# Patient Record
Sex: Male | Born: 1965 | Race: White | Hispanic: No | Marital: Married | Smoking: Never smoker
Health system: Southern US, Community
[De-identification: ages and names within clinical notes are randomized; demographics above are authoritative.]

## PROBLEM LIST (undated history)

## (undated) DIAGNOSIS — C419 Malignant neoplasm of bone and articular cartilage, unspecified: Secondary | ICD-10-CM

## (undated) DIAGNOSIS — I729 Aneurysm of unspecified site: Secondary | ICD-10-CM

## (undated) DIAGNOSIS — K219 Gastro-esophageal reflux disease without esophagitis: Secondary | ICD-10-CM

## (undated) DIAGNOSIS — C61 Malignant neoplasm of prostate: Secondary | ICD-10-CM

## (undated) HISTORY — DX: Gastro-esophageal reflux disease without esophagitis: K21.9

## (undated) HISTORY — PX: APPENDECTOMY: SHX54

---

## 2004-07-03 ENCOUNTER — Ambulatory Visit: Payer: Self-pay | Admitting: Gastroenterology

## 2005-12-22 ENCOUNTER — Emergency Department: Payer: Self-pay | Admitting: Emergency Medicine

## 2008-05-21 ENCOUNTER — Ambulatory Visit: Payer: Self-pay | Admitting: Internal Medicine

## 2008-06-18 DIAGNOSIS — Z86018 Personal history of other benign neoplasm: Secondary | ICD-10-CM

## 2008-06-18 DIAGNOSIS — D239 Other benign neoplasm of skin, unspecified: Secondary | ICD-10-CM

## 2008-06-18 HISTORY — DX: Personal history of other benign neoplasm: Z86.018

## 2008-06-18 HISTORY — DX: Other benign neoplasm of skin, unspecified: D23.9

## 2008-08-12 ENCOUNTER — Ambulatory Visit: Payer: Self-pay | Admitting: Internal Medicine

## 2009-07-17 ENCOUNTER — Ambulatory Visit: Payer: Self-pay | Admitting: Internal Medicine

## 2009-08-26 ENCOUNTER — Encounter: Admission: RE | Admit: 2009-08-26 | Discharge: 2009-08-26 | Payer: Self-pay | Admitting: Specialist

## 2010-05-14 ENCOUNTER — Ambulatory Visit: Payer: Self-pay | Admitting: Internal Medicine

## 2010-10-21 ENCOUNTER — Encounter: Payer: Self-pay | Admitting: Specialist

## 2011-05-17 ENCOUNTER — Ambulatory Visit: Payer: Self-pay

## 2011-09-12 IMAGING — CT CT HEAD WITHOUT CONTRAST
2 series · 15 of 30 positions shown, 19 images · non-contrast
Comparison: none

REASON FOR EXAM: COMMENTS:

[Series 2: without · axial · non-contrast · 0.40mm/px · z∈[-652,-528]mm · 13 of 31 slices shown, 17 images]
[im 3/31  brain]
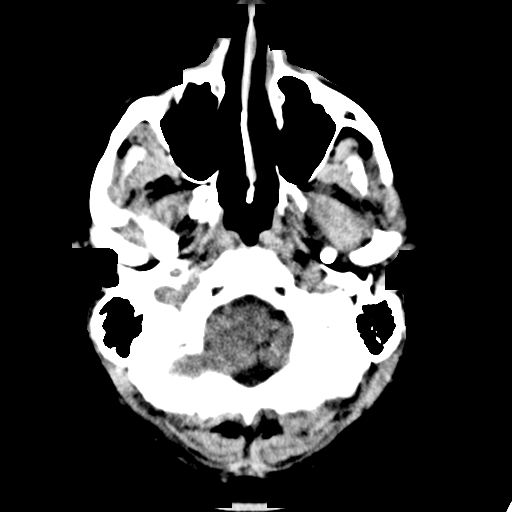
[im 3/31  bone]
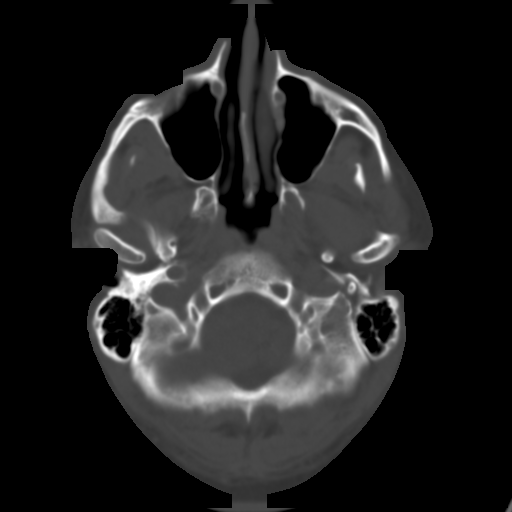
[im 5/31  brain]
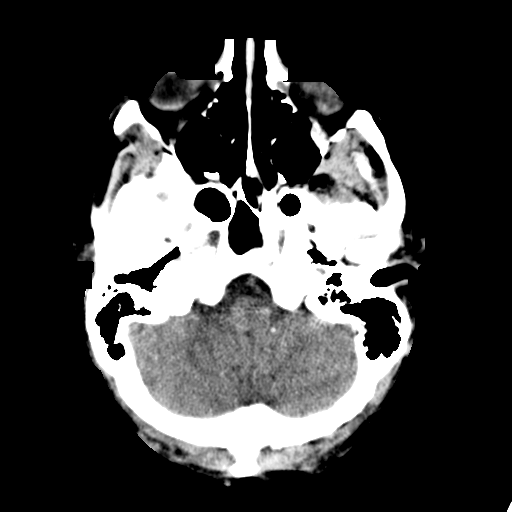
[im 7/31  brain]
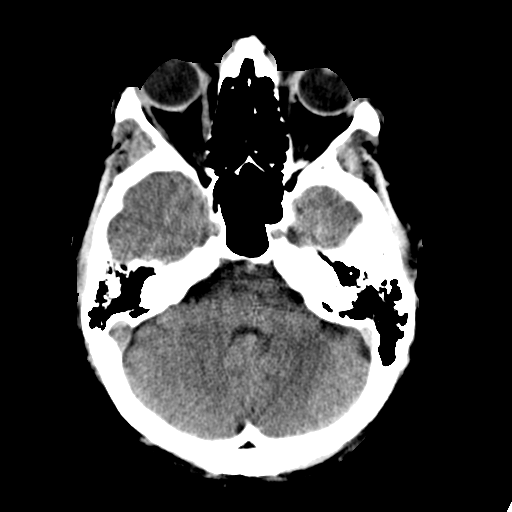
[im 9/31  brain]
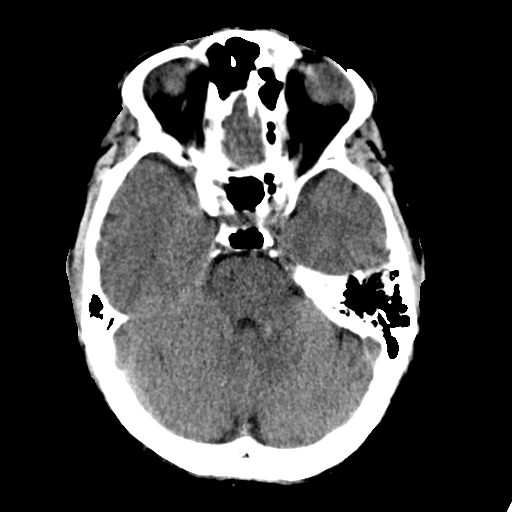
[im 11/31  brain]
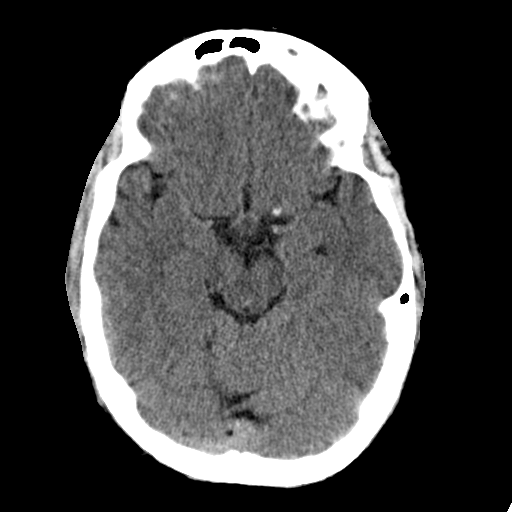
[im 11/31  bone]
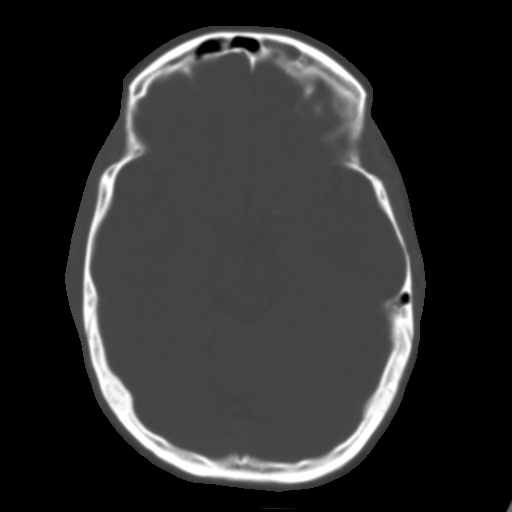
[im 13/31  brain]
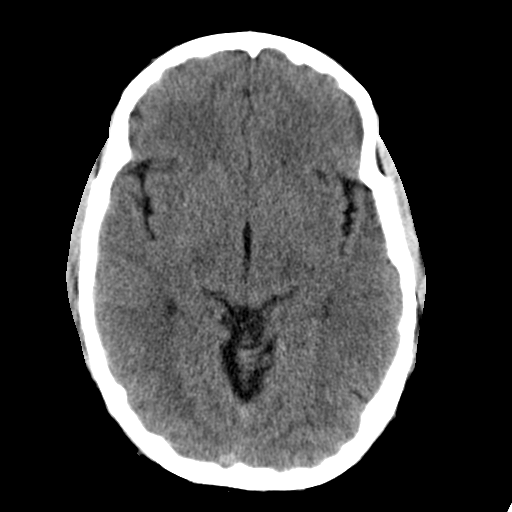
[im 16/31  brain]
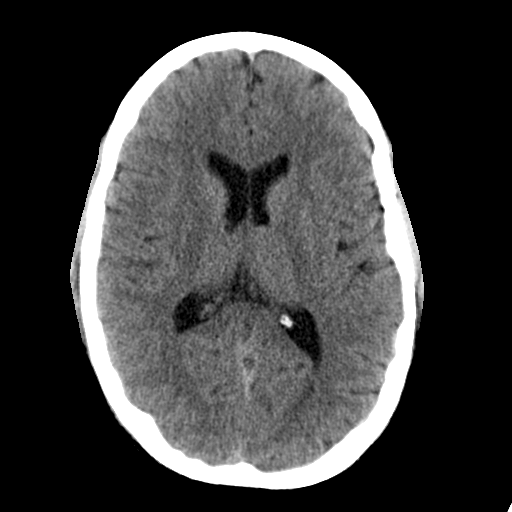
[im 18/31  brain]
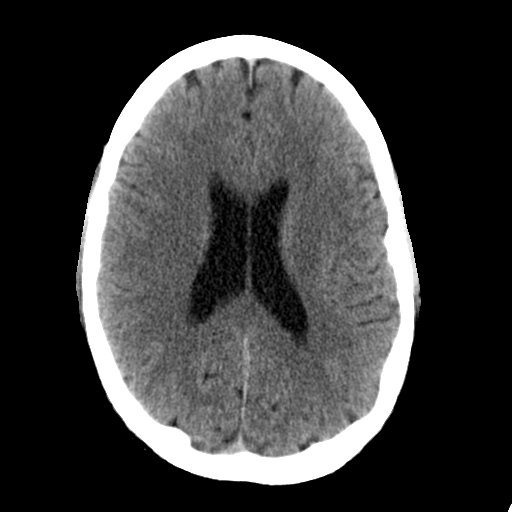
[im 20/31  brain]
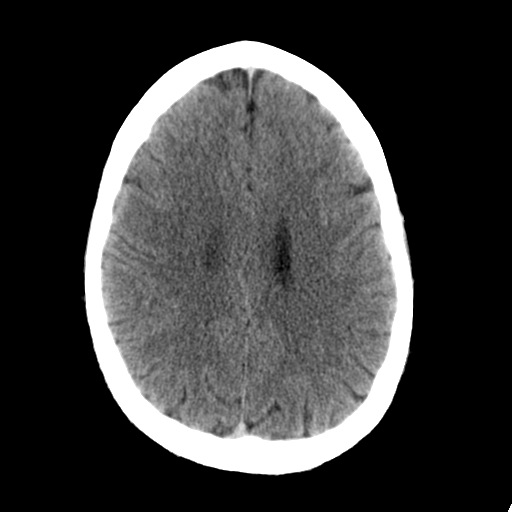
[im 20/31  bone]
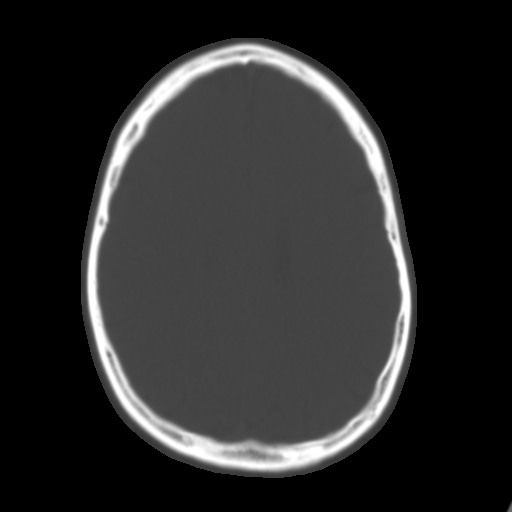
[im 22/31  brain]
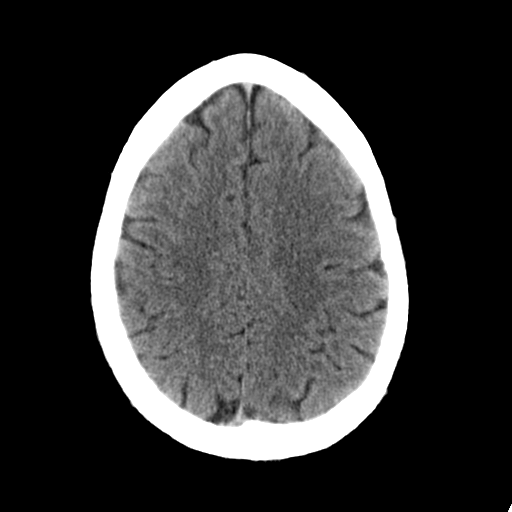
[im 24/31  brain]
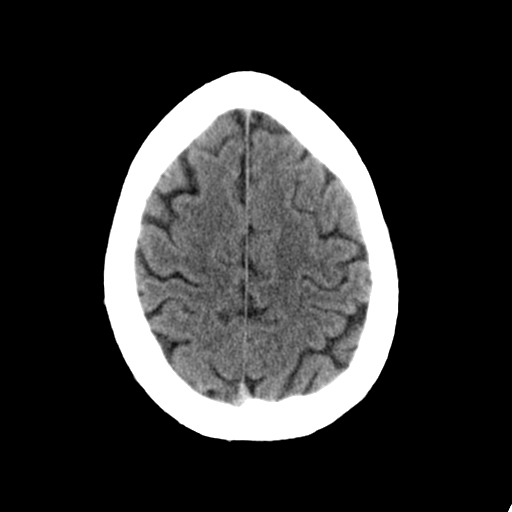
[im 26/31  brain]
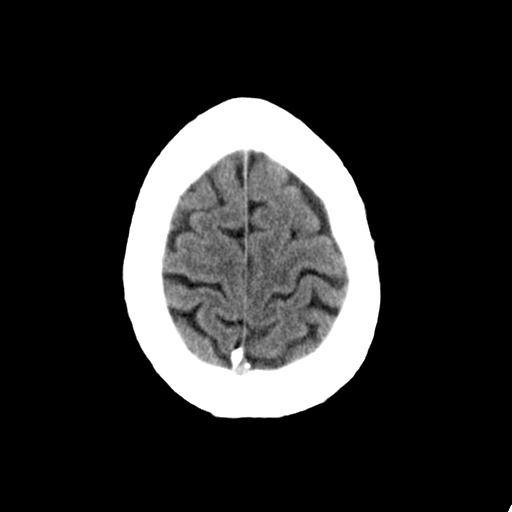
[im 28/31  brain]
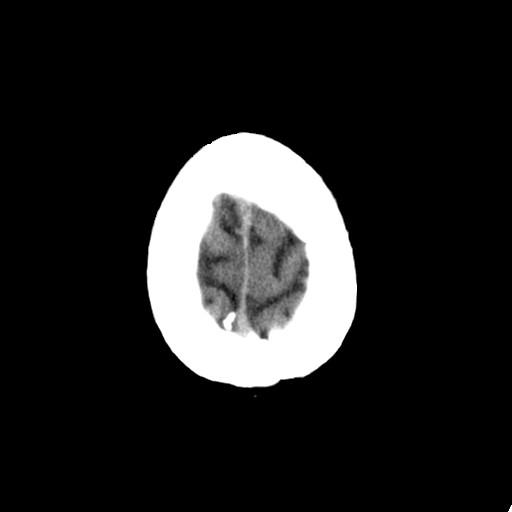
[im 28/31  bone]
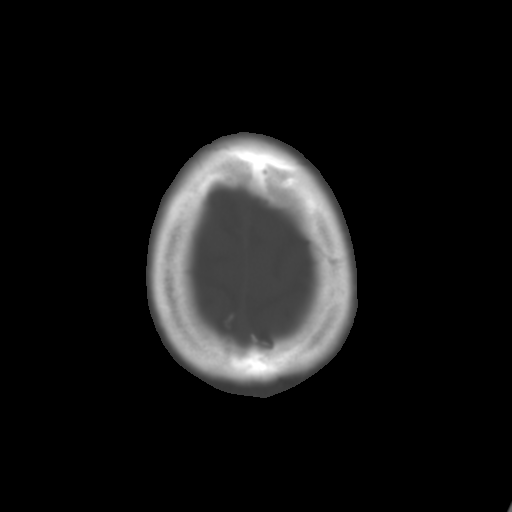

[Series 3: bone · axial · 0.40mm/px · z∈[-652,-632]mm · 2 of 31 slices shown]
[im 3/31  bone]
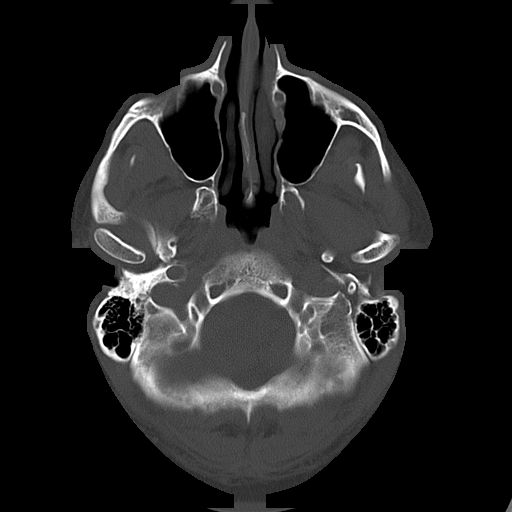
[im 7/31  bone]
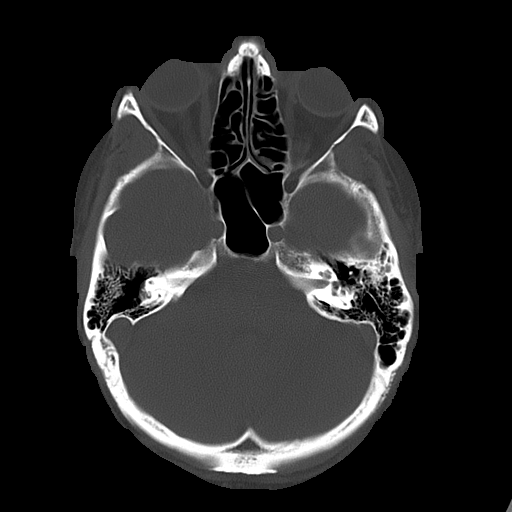

[15 of 30 positions shown; findings below may reference images not displayed]

PROCEDURE:     CT  - CT HEAD WITHOUT CONTRAST  - July 17, 2009  [DATE]

RESULT:      Axial noncontrast CT scanning was performed through the brain
at 5 mm intervals and slice thicknesses. The ventricles are normal in size
and position. There is no intracranial hemorrhage nor intracranial mass
effect. The cerebellum and brainstem are normal in density. There is no
evidence of an evolving ischemic infarction. Within the observed portions of
the maxillary sinuses there large soft tissue density structures compatible
with retention cysts. The remainder of the visualized portions of the
paranasal sinuses are clear. The mastoid air cells are well pneumatized.
There is no evidence of an acute skull fracture.
IMPRESSION: 1. I see no acute abnormality of the brain. Specifically there are no
findings suspicious for an intracranial hemorrhage nor are there findings to
suggest intracranial aneurysms. Further evaluation with MRI and MR
angiography could be performed to evaluate the intracranial vascularity.
2. There are soft tissue density structures within the maxillary sinuses
most compatible with retention cysts.

Addendum: The left frontal sinus contains soft tissue density material which
may reflect acute or chronic inflammatory change. No air-fluid level within
the left frontal sinuses is seen.

## 2012-10-02 ENCOUNTER — Ambulatory Visit: Payer: Self-pay | Admitting: Physician Assistant

## 2012-10-12 ENCOUNTER — Ambulatory Visit: Payer: Self-pay | Admitting: Orthopedic Surgery

## 2012-11-18 ENCOUNTER — Encounter: Payer: Self-pay | Admitting: General Surgery

## 2012-12-19 ENCOUNTER — Encounter: Payer: Self-pay | Admitting: General Surgery

## 2013-09-10 ENCOUNTER — Ambulatory Visit: Payer: Self-pay | Admitting: Physician Assistant

## 2013-11-05 ENCOUNTER — Ambulatory Visit: Payer: Self-pay | Admitting: Orthopedic Surgery

## 2013-11-16 ENCOUNTER — Ambulatory Visit: Payer: Self-pay | Admitting: Orthopedic Surgery

## 2014-02-19 ENCOUNTER — Ambulatory Visit: Payer: Self-pay | Admitting: Physician Assistant

## 2014-07-01 ENCOUNTER — Ambulatory Visit: Payer: Self-pay | Admitting: Gastroenterology

## 2015-10-03 ENCOUNTER — Encounter: Payer: Self-pay | Admitting: Emergency Medicine

## 2015-10-03 ENCOUNTER — Emergency Department
Admission: EM | Admit: 2015-10-03 | Discharge: 2015-10-03 | Disposition: A | Discharge status: Home / Self Care | Payer: 59 | Attending: Emergency Medicine | Admitting: Emergency Medicine

## 2015-10-03 ENCOUNTER — Emergency Department: Payer: 59

## 2015-10-03 DIAGNOSIS — R51 Headache: Secondary | ICD-10-CM | POA: Diagnosis present

## 2015-10-03 DIAGNOSIS — R519 Headache, unspecified: Secondary | ICD-10-CM

## 2015-10-03 HISTORY — DX: Aneurysm of unspecified site: I72.9

## 2015-10-03 LAB — CBC WITH DIFFERENTIAL/PLATELET
BASOS ABS: 0.1 10*3/uL (ref 0–0.1)
BASOS PCT: 1 %
EOS ABS: 0.3 10*3/uL (ref 0–0.7)
EOS PCT: 4 %
HCT: 40.9 % (ref 40.0–52.0)
Hemoglobin: 13.7 g/dL (ref 13.0–18.0)
Lymphocytes Relative: 29 %
Lymphs Abs: 2.5 10*3/uL (ref 1.0–3.6)
MCH: 28.7 pg (ref 26.0–34.0)
MCHC: 33.4 g/dL (ref 32.0–36.0)
MCV: 86 fL (ref 80.0–100.0)
Monocytes Absolute: 0.7 10*3/uL (ref 0.2–1.0)
Monocytes Relative: 9 %
Neutro Abs: 4.9 10*3/uL (ref 1.4–6.5)
Neutrophils Relative %: 57 %
PLATELETS: 242 10*3/uL (ref 150–440)
RBC: 4.76 MIL/uL (ref 4.40–5.90)
RDW: 13.4 % (ref 11.5–14.5)
WBC: 8.5 10*3/uL (ref 3.8–10.6)

## 2015-10-03 LAB — PROTIME-INR
INR: 0.96
PROTHROMBIN TIME: 13 s (ref 11.4–15.0)

## 2015-10-03 LAB — BASIC METABOLIC PANEL
ANION GAP: 5 (ref 5–15)
BUN: 17 mg/dL (ref 6–20)
CALCIUM: 9.2 mg/dL (ref 8.9–10.3)
CO2: 28 mmol/L (ref 22–32)
Chloride: 105 mmol/L (ref 101–111)
Creatinine, Ser: 0.91 mg/dL (ref 0.61–1.24)
GFR calc Af Amer: >60 mL/min (ref >60)
Glucose, Bld: 100 mg/dL — ABNORMAL HIGH (ref 65–99)
POTASSIUM: 4.2 mmol/L (ref 3.5–5.1)
SODIUM: 138 mmol/L (ref 135–145)

## 2015-10-03 LAB — SEDIMENTATION RATE: SED RATE: 11 mm/h (ref 0–20)

## 2015-10-03 LAB — APTT: APTT: 29 s (ref 24–36)

## 2015-10-03 MED ORDER — BUTALBITAL-APAP-CAFFEINE 50-325-40 MG PO TABS: 1.0000 | ORAL_TABLET | Freq: Four times a day (QID) | ORAL | Status: AC | PRN

## 2015-10-03 MED ORDER — SODIUM CHLORIDE 0.9 % IV BOLUS (SEPSIS)
1000.0000 mL | Freq: Once | INTRAVENOUS | Status: AC
  Administered 2015-10-03: 1000 mL via INTRAVENOUS

## 2015-10-03 MED ORDER — PROCHLORPERAZINE EDISYLATE 5 MG/ML IJ SOLN
10.0000 mg | Freq: Once | INTRAMUSCULAR | Status: AC
  Administered 2015-10-03: 10 mg via INTRAVENOUS
  Filled 2015-10-03: qty 2

## 2015-10-03 MED ORDER — DIPHENHYDRAMINE HCL 50 MG/ML IJ SOLN
50.0000 mg | Freq: Once | INTRAMUSCULAR | Status: AC
  Administered 2015-10-03: 50 mg via INTRAVENOUS

## 2015-10-03 MED ORDER — LIDOCAINE HCL (PF) 1 % IJ SOLN
INTRAMUSCULAR | Status: AC
  Administered 2015-10-03: 5 mL via INTRADERMAL
  Filled 2015-10-03: qty 5

## 2015-10-03 MED ORDER — LIDOCAINE HCL (PF) 1 % IJ SOLN
5.0000 mL | Freq: Once | INTRAMUSCULAR | Status: AC
  Administered 2015-10-03: 5 mL via INTRADERMAL

## 2015-10-03 MED ORDER — DIPHENHYDRAMINE HCL 50 MG/ML IJ SOLN
INTRAMUSCULAR | Status: AC
  Administered 2015-10-03: 50 mg via INTRAVENOUS
  Filled 2015-10-03: qty 1

## 2015-10-03 NOTE — ED Notes (Signed)
Dr Clearnce Hasten unable to obtain CSF, pt opted to not continue for LP procedure.

## 2015-10-03 NOTE — ED Provider Notes (Signed)
[facility] Emergency Department Provider Note  ____________________________________________  Time seen: Approximately [id number] PM  I have reviewed the triage vital signs and the nursing notes.   HISTORY  Chief Complaint Headache    HPI Ralph Villegas is a 50 y.o. male with a history of a cerebral aneurysm who is presenting today with a right-sided headache over the past 24 hours. He said that last night he had a sudden onset "20 out of 10" headache to the right side of his head which felt like a stabbing pain is of an arrow was going into his head. He said that the light as well as noises worsen his headache. He has no vision change. Denies any nausea or vomiting. Michela Pitcher had been diagnosed with a cerebral aneurysm many years ago and "had some shots." Michela Pitcher his sister also died from a cerebral aneurysm at 52 years old. Denies any neck stiffness. Denies any numbness or weakness.Patient says also has a history of cluster headaches but has not had them in many years. Does not remember if that felt exactly like this.   Past Medical History  Diagnosis Date  . Aneurysm ([place])     There are no active problems to display for this patient.   History reviewed. No pertinent past surgical history.  No current outpatient prescriptions on file.  Allergies Review of patient's allergies indicates no known allergies.  No family history on file.  Social History Social History  Substance Use Topics  . Smoking status: Never Smoker   . Smokeless tobacco: None  . Alcohol Use: Yes    Review of Systems Constitutional: No fever/chills Eyes: No visual changes. ENT: No sore throat. Cardiovascular: Denies chest pain. Respiratory: Denies shortness of breath. Gastrointestinal: No abdominal pain.  No nausea, no vomiting.  No diarrhea.  No constipation. Genitourinary: Negative for dysuria. Musculoskeletal: Negative for back pain. Skin: Negative for rash. Neurological: Negative  for focal weakness or numbness.  10-point ROS otherwise negative.  ____________________________________________   PHYSICAL EXAM:  VITAL SIGNS: ED Triage Vitals  Enc Vitals Group     BP 10/03/15 1736 112/72 mmHg     Pulse Rate 10/03/15 1736 77     Resp 10/03/15 1736 18     Temp 10/03/15 1736 98.2 F (36.8 C)     Temp Source 10/03/15 1736 Oral     SpO2 10/03/15 1736 98 %     Weight 10/03/15 1736 233 lb (105.688 kg)     Height 10/03/15 1736 5\' 10"  (1.778 m)     Head Cir --      Peak Flow --      Pain Score 10/03/15 1752 3     Pain Loc --      Pain Edu? --      Excl. in [place]? --     Constitutional: Alert and oriented. Well appearing and in no acute distress. Eyes: Conjunctivae are normal. PERRL. EOMI. Head: Atraumatic. No tenderness to the right temple or nodularity along the temporal arteries. Nose: No congestion/rhinnorhea. Mouth/Throat: Mucous membranes are moist.  Oropharynx non-erythematous. Neck: No stridor.  No meningismus. Cardiovascular: Normal rate, regular rhythm. Grossly normal heart sounds.  Good peripheral circulation. Respiratory: Normal respiratory effort.  No retractions. Lungs CTAB. Gastrointestinal: Soft and nontender. No distention. No abdominal bruits. No CVA tenderness. Musculoskeletal: No lower extremity tenderness nor edema.  No joint effusions. Neurologic:  Normal speech and language. No gross focal neurologic deficits are appreciated. No gait instability. Skin:  Skin is warm, dry  and intact. No rash noted. Psychiatric: Mood and affect are normal. Speech and behavior are normal.  ____________________________________________   LABS (all labs ordered are listed, but only abnormal results are displayed)  Labs Reviewed  BASIC METABOLIC PANEL - Abnormal; Notable for the following:    Glucose, Bld 100 (*)    All other components within normal limits  CBC WITH DIFFERENTIAL/PLATELET  PROTIME-INR  APTT  SEDIMENTATION RATE  CSF CELL COUNT WITH  DIFFERENTIAL  CSF CELL COUNT WITH DIFFERENTIAL   ____________________________________________  EKG   ____________________________________________  RADIOLOGY  Normal CT of the brain. ____________________________________________   PROCEDURES  LUMBAR PUNCTURE  Date/Time: 10/03/2015 at 1010pm Performed by: Doran Stabler  Consent: Verbal consent obtained. Written consent obtained. Risks and benefits: risks, benefits and alternatives were discussed Consent given by: Patient Patient understanding: patient states understanding of the procedure being performed  Patient consent: the patient's understanding of the procedure matches consent given  Procedure consent: procedure consent matches procedure scheduled  Relevant documents: relevant documents present and verified  Test results: test results available and properly labeled Site marked: the operative site was marked Imaging studies: imaging studies available  Required items: required blood products, implants, devices, and special equipment available  Patient identity confirmed: verbally with patient and arm band  Time out: Immediately prior to procedure a "time out" was called to verify the correct patient, procedure, equipment, support staff and site/side marked as required.  Indications: Sudden onset headache with history of aneurysm  Anesthesia: local infiltration Local anesthetic: lidocaine 1% without epinephrine Anesthetic total: 7 ml Patient sedated: No  Analgesia: Local  Preparation: Patient was prepped and draped in the usual sterile fashion. Lumbar space: L3-L4 interspace Patient's position: left lateral decubitus Needle gauge: 22 Needle length: 3.5 in Number of attempts: 1  Several attempts made with only bone contacted. Position 3 times with still bone just contacted. Attempted to reposition patient with head further towards knees and needs to chest. Patient asked to stop procedure secondary to multiple  attempts without success.  Post-procedure: site cleaned and adhesive bandage applied Patient tolerance: Patient tolerated the procedure well with no immediate complications  ____________________________________________   INITIAL IMPRESSION / ASSESSMENT AND PLAN / ED COURSE  Pertinent labs & imaging results that were available during my care of the patient were reviewed by me and considered in my medical decision making (see chart for details).  ----------------------------------------- 10:49 PM on 10/03/2015 -----------------------------------------  Patient completely headache free at this time. Although the patient is a high risk for aneurysmal bleed it is possible that this could be cluster headache which the patient also has a history of. He understands that not pursuing further workup at this time could result in severe) disability and possibly death. He is acutely aware of this because it persisted that died at 50 years old. However, he is refusing further attempts for LP. He understands the risks. Will be discharged home. Patient understands that he will need to follow-up with his headache specialist in [facility] which he is seen in the past. Understands return to the emergency department immediately for any worsening or concerning symptoms.   ____________________________________________   FINAL CLINICAL IMPRESSION(S) / ED DIAGNOSES  Headache. Resolved.    Orbie Pyo, MD 10/03/15 [phone]

## 2015-10-03 NOTE — Discharge Instructions (Signed)

## 2015-10-03 NOTE — ED Notes (Signed)
Pt presents with headache that started last night. Pt states out of the blue felt like had been hit in the head with a bow and arrow. Pt c/o right side headache.  Pt with family  Hx of aneurysms and sister died with same. Pt dx with small stable aneurysm 10-12 yrs ago. Pt denies any vision changes.

## 2015-10-03 NOTE — ED Notes (Signed)
Pt called on call bell, pt c/o agitation and anxiety, informed Dr Clearnce Hasten, new orders carried out.

## 2015-10-15 ENCOUNTER — Other Ambulatory Visit: Payer: Self-pay | Admitting: Nurse Practitioner

## 2015-10-15 DIAGNOSIS — R519 Headache, unspecified: Secondary | ICD-10-CM

## 2015-10-15 DIAGNOSIS — R51 Headache: Principal | ICD-10-CM

## 2015-10-29 ENCOUNTER — Other Ambulatory Visit: Payer: Self-pay | Admitting: Nurse Practitioner

## 2015-10-29 ENCOUNTER — Ambulatory Visit: Payer: 59

## 2015-10-29 ENCOUNTER — Ambulatory Visit
Admission: RE | Admit: 2015-10-29 | Discharge: 2015-10-29 | Disposition: A | Discharge status: Home / Self Care | Payer: 59 | Source: Ambulatory Visit | Attending: Nurse Practitioner | Admitting: Nurse Practitioner

## 2015-10-29 DIAGNOSIS — R51 Headache: Secondary | ICD-10-CM | POA: Diagnosis present

## 2015-10-29 DIAGNOSIS — I671 Cerebral aneurysm, nonruptured: Secondary | ICD-10-CM | POA: Insufficient documentation

## 2015-10-29 DIAGNOSIS — R519 Headache, unspecified: Secondary | ICD-10-CM

## 2016-11-05 DIAGNOSIS — R42 Dizziness and giddiness: Secondary | ICD-10-CM | POA: Diagnosis not present

## 2016-11-05 DIAGNOSIS — R0600 Dyspnea, unspecified: Secondary | ICD-10-CM | POA: Diagnosis not present

## 2016-11-05 DIAGNOSIS — I671 Cerebral aneurysm, nonruptured: Secondary | ICD-10-CM | POA: Diagnosis not present

## 2016-11-09 ENCOUNTER — Other Ambulatory Visit: Payer: Self-pay | Admitting: Internal Medicine

## 2016-11-09 DIAGNOSIS — I671 Cerebral aneurysm, nonruptured: Secondary | ICD-10-CM

## 2016-11-10 DIAGNOSIS — I6523 Occlusion and stenosis of bilateral carotid arteries: Secondary | ICD-10-CM | POA: Diagnosis not present

## 2016-11-17 DIAGNOSIS — R0600 Dyspnea, unspecified: Secondary | ICD-10-CM | POA: Diagnosis not present

## 2016-11-19 ENCOUNTER — Ambulatory Visit
Admission: RE | Admit: 2016-11-19 | Discharge: 2016-11-19 | Disposition: A | Discharge status: Home / Self Care | Payer: 59 | Source: Ambulatory Visit | Attending: Internal Medicine | Admitting: Internal Medicine

## 2016-11-19 DIAGNOSIS — I671 Cerebral aneurysm, nonruptured: Secondary | ICD-10-CM | POA: Insufficient documentation

## 2016-12-14 DIAGNOSIS — Z125 Encounter for screening for malignant neoplasm of prostate: Secondary | ICD-10-CM | POA: Diagnosis not present

## 2016-12-14 DIAGNOSIS — Z131 Encounter for screening for diabetes mellitus: Secondary | ICD-10-CM | POA: Diagnosis not present

## 2016-12-14 DIAGNOSIS — E781 Pure hyperglyceridemia: Secondary | ICD-10-CM | POA: Diagnosis not present

## 2016-12-14 DIAGNOSIS — Z0001 Encounter for general adult medical examination with abnormal findings: Secondary | ICD-10-CM | POA: Diagnosis not present

## 2016-12-16 DIAGNOSIS — M545 Low back pain: Secondary | ICD-10-CM | POA: Diagnosis not present

## 2016-12-16 DIAGNOSIS — R0600 Dyspnea, unspecified: Secondary | ICD-10-CM | POA: Diagnosis not present

## 2016-12-16 DIAGNOSIS — I6523 Occlusion and stenosis of bilateral carotid arteries: Secondary | ICD-10-CM | POA: Diagnosis not present

## 2016-12-20 ENCOUNTER — Other Ambulatory Visit: Payer: Self-pay

## 2017-01-12 ENCOUNTER — Encounter: Payer: Self-pay | Admitting: Urology

## 2017-01-12 ENCOUNTER — Ambulatory Visit (INDEPENDENT_AMBULATORY_CARE_PROVIDER_SITE_OTHER): Payer: 59 | Admitting: Urology

## 2017-01-12 DIAGNOSIS — N410 Acute prostatitis: Secondary | ICD-10-CM | POA: Diagnosis not present

## 2017-01-12 DIAGNOSIS — R972 Elevated prostate specific antigen [PSA]: Secondary | ICD-10-CM | POA: Diagnosis not present

## 2017-01-12 MED ORDER — TAMSULOSIN HCL 0.4 MG PO CAPS: 0.4000 mg | ORAL_CAPSULE | Freq: Every day | ORAL | 0 refills | Status: DC

## 2017-01-12 MED ORDER — SULFAMETHOXAZOLE-TRIMETHOPRIM 800-160 MG PO TABS: 1.0000 | ORAL_TABLET | Freq: Two times a day (BID) | ORAL | 0 refills | Status: DC

## 2017-01-12 MED ORDER — SULFAMETHOXAZOLE-TRIMETHOPRIM 800-160 MG PO TABS: 1.0000 | ORAL_TABLET | Freq: Two times a day (BID) | ORAL | 0 refills | Status: AC

## 2017-01-12 MED ORDER — SULFAMETHOXAZOLE-TRIMETHOPRIM 800-160 MG PO TABS: 1.0000 | ORAL_TABLET | Freq: Once | ORAL | 0 refills | Status: AC

## 2017-01-12 MED ORDER — MELOXICAM 15 MG PO TABS: 15.0000 mg | ORAL_TABLET | Freq: Every day | ORAL | 0 refills | Status: DC

## 2017-01-12 NOTE — Progress Notes (Signed)
01/12/2017 11:33 AM   Ralph Villegas 1965-09-22 [id number]  Referring provider: Lavera Guise, MD [address] [place], [place] [address]  Chief Complaint  Patient presents with  . Elevated PSA    New patient     HPI: 51 year old otherwise healthy male presents to the urology clinic for further management and evaluation of an elevated PSA.  The patient was noted to have an elevated PSA as part of a routine physical exam. The patient states that he has had maybe some slight discomfort and more frequent urination recently, however he is not overly symptomatic. He also describes a weak stream.he denies any dysuria or gross hematuria.  The patient has been seen by urology in 2014 and diagnosed with prostatitis Since that time, he has not had any urinary tract infections or new problems. He denies a history of nephrolithiasis.     PSA history: 11/2011:  2.5 12/20/16: 12.8  PMH: Past Medical History:  Diagnosis Date  . Acid reflux   . Aneurysm [facility])     Surgical History: Past Surgical History:  Procedure Laterality Date  . APPENDECTOMY      Home Medications:  Allergies as of 01/12/2017      Reactions   Acyclovir And Related       Medication List    as of 01/12/2017 11:33 AM   You have not been prescribed any medications.     Allergies:  Allergies  Allergen Reactions  . Acyclovir And Related     Family History: Family History  Problem Relation Age of Onset  . Prostate cancer Maternal Uncle     Social History:  reports that he has never smoked. He has never used smokeless tobacco. He reports that he drinks alcohol. His drug history is not on file.  ROS: UROLOGY Frequent Urination?: Yes Hard to postpone urination?: No Burning/pain with urination?: No Get up at night to urinate?: No Leakage of urine?: No Urine stream starts and stops?: No Trouble starting stream?: No Do you have to strain to urinate?: No Blood in urine?: No Urinary tract infection?:  No Sexually transmitted disease?: No Injury to kidneys or bladder?: No Painful intercourse?: No Weak stream?: Yes Erection problems?: No Penile pain?: No  Gastrointestinal Nausea?: No Vomiting?: No Indigestion/heartburn?: No Diarrhea?: No Constipation?: No  Constitutional Fever: No Night sweats?: No Weight loss?: No Fatigue?: No  Skin Skin rash/lesions?: No Itching?: No  Eyes Blurred vision?: No Double vision?: No  Ears/Nose/Throat Sore throat?: No Sinus problems?: No  Hematologic/Lymphatic Swollen glands?: No Easy bruising?: No  Cardiovascular Leg swelling?: No Chest pain?: No  Respiratory Cough?: No Shortness of breath?: No  Endocrine Excessive thirst?: No  Musculoskeletal Back pain?: Yes Joint pain?: No  Neurological Headaches?: Yes Dizziness?: No  Psychologic Depression?: No Anxiety?: No  Physical Exam: There were no vitals taken for this visit.  Constitutional:  Alert and oriented, No acute distress. HEENT: [place] AT, moist mucus membranes.  Trachea midline, no masses. Cardiovascular: No clubbing, cyanosis, or edema. Respiratory: Normal respiratory effort, no increased work of breathing. GI: Abdomen is soft, nontender, nondistended, no abdominal masses GU: No CVA tenderness. DRE: Prostate is indurated and severely tender diffusely. There are no discrete nodules Skin: No rashes, bruises or suspicious lesions. Lymph: No cervical or inguinal adenopathy. Neurologic: Grossly intact, no focal deficits, moving all 4 extremities. Psychiatric: Normal mood and affect.  Laboratory Data: Lab Results  Component Value Date   WBC 8.5 10/03/2015   HGB 13.7 10/03/2015   HCT  40.9 10/03/2015   MCV 86.0 10/03/2015   PLT 242 10/03/2015    Lab Results  Component Value Date   CREATININE 0.91 10/03/2015    No results found for: PSA  No results found for: TESTOSTERONE  No results found for: HGBA1C  Urinalysis No results found for: COLORURINE,  APPEARANCEUR, LABSPEC, PHURINE, GLUCOSEU, HGBUR, BILIRUBINUR, KETONESUR, PROTEINUR, UROBILINOGEN, NITRITE, LEUKOCYTESUR  Pertinent Imaging:   Assessment & Plan:  The patient presenten elevated PSA, I suspect what he actually has acute prostatitis.This is based on his voiding symptoms and tenderness to palpation on his DRE. We are treating him as such.  I have prescribed one month Bactrim double strength twice a day, tamsulosin 0.4 milligrams daily, and Mobitz 15 mg daily. We will plan to follow up with the patient 6 weeks with a PSA prior.  1. Elevated PSA    Return in about 6 weeks (around 02/23/2017) for w/ PSA prior.  Ardis Hughs, [facility] [address] [place], [place] [address] [phone]

## 2017-01-12 NOTE — Addendum Note (Signed)
Addended by: Wilson Singer on: 01/12/2017 01:51 PM   Modules accepted: Orders

## 2017-01-12 NOTE — Addendum Note (Signed)
Addended by: Wilson Singer on: 01/12/2017 04:58 PM   Modules accepted: Orders

## 2017-02-17 ENCOUNTER — Other Ambulatory Visit: Payer: Self-pay

## 2017-02-17 DIAGNOSIS — R972 Elevated prostate specific antigen [PSA]: Secondary | ICD-10-CM

## 2017-02-21 ENCOUNTER — Other Ambulatory Visit: Payer: 59

## 2017-02-21 DIAGNOSIS — R972 Elevated prostate specific antigen [PSA]: Secondary | ICD-10-CM

## 2017-02-22 LAB — PSA: Prostate Specific Ag, Serum: 14.8 ng/mL — ABNORMAL HIGH (ref 0.0–4.0)

## 2017-02-23 ENCOUNTER — Encounter: Payer: Self-pay | Admitting: Urology

## 2017-02-23 ENCOUNTER — Ambulatory Visit (INDEPENDENT_AMBULATORY_CARE_PROVIDER_SITE_OTHER): Payer: 59 | Admitting: Urology

## 2017-02-23 VITALS — BP 93/58 | HR 70 | Ht 70.0 in | Wt 229.7 lb

## 2017-02-23 DIAGNOSIS — N410 Acute prostatitis: Secondary | ICD-10-CM | POA: Diagnosis not present

## 2017-02-23 DIAGNOSIS — R972 Elevated prostate specific antigen [PSA]: Secondary | ICD-10-CM

## 2017-02-23 LAB — URINALYSIS, COMPLETE
Bilirubin, UA: NEGATIVE
GLUCOSE, UA: NEGATIVE
Ketones, UA: NEGATIVE
LEUKOCYTES UA: NEGATIVE
Nitrite, UA: NEGATIVE
PH UA: 7 (ref 5.0–7.5)
PROTEIN UA: NEGATIVE
RBC UA: NEGATIVE
SPEC GRAV UA: 1.015 (ref 1.005–1.030)
Urobilinogen, Ur: 0.2 mg/dL (ref 0.2–1.0)

## 2017-02-23 NOTE — Progress Notes (Signed)
02/23/2017 10:14 AM   Ralph Villegas 12-23-1965  It is oozing a biopsy and biopsy instructions and down his urine was clear [id number]  Referring provider: Lavera Guise, MD [address] [place], [place] [address]  Chief Complaint  Patient presents with  . Elevated PSA    HPI:  51 year old otherwise healthy male presents to the urology clinic for further management and evaluation of an elevated PSA.  The patient was noted to have an elevated PSA as part of a routine physical exam. The patient states that he has had maybe some slight discomfort and more frequent urination recently, however he is not overly symptomatic. He also describes a weak stream.he denies any dysuria or gross hematuria.  No FH PCa.   PSA history: 11/2011:  2.5 12/20/16: 12.8  He was seen 01/12/2017 when on DRE the prostate was noted to be indurated and severely tender. I do not see a urinalysis. It was hypothesized he had prostatitis and prescribed Bactrim. His PSA rose to 14.8.  UA clear.   PMH: Past Medical History:  Diagnosis Date  . Acid reflux   . Aneurysm [facility])     Surgical History: Past Surgical History:  Procedure Laterality Date  . APPENDECTOMY      Home Medications:  Allergies as of 02/23/2017      Reactions   Acyclovir And Related       Medication List       Accurate as of 02/23/17 10:14 AM. Always use your most recent med list.          meloxicam 15 MG tablet Commonly known as:  MOBIC Take 1 tablet (15 mg total) by mouth daily.   tamsulosin 0.4 MG Caps capsule Commonly known as:  FLOMAX Take 1 capsule (0.4 mg total) by mouth daily.       Allergies:  Allergies  Allergen Reactions  . Acyclovir And Related     Family History: Family History  Problem Relation Age of Onset  . Prostate cancer Maternal Uncle     Social History:  reports that he has never smoked. He has never used smokeless tobacco. He reports that he drinks alcohol. His drug history is not on  file.  ROS: UROLOGY Frequent Urination?: No Hard to postpone urination?: No Burning/pain with urination?: No Get up at night to urinate?: No Leakage of urine?: No Urine stream starts and stops?: No Trouble starting stream?: No Do you have to strain to urinate?: No Blood in urine?: No Urinary tract infection?: No Sexually transmitted disease?: No Injury to kidneys or bladder?: No Painful intercourse?: No Weak stream?: Yes Erection problems?: No Penile pain?: No  Gastrointestinal Nausea?: No Vomiting?: No Indigestion/heartburn?: No Diarrhea?: No Constipation?: No  Constitutional Fever: No Night sweats?: No Weight loss?: No Fatigue?: No  Skin Skin rash/lesions?: No Itching?: No  Eyes Blurred vision?: No Double vision?: No  Ears/Nose/Throat Sore throat?: No Sinus problems?: No  Hematologic/Lymphatic Swollen glands?: No Easy bruising?: No  Cardiovascular Leg swelling?: No Chest pain?: No  Respiratory Cough?: No Shortness of breath?: No  Endocrine Excessive thirst?: No  Musculoskeletal Back pain?: No Joint pain?: No  Neurological Headaches?: No Dizziness?: No  Psychologic Depression?: No Anxiety?: No  Physical Exam: BP (!) 93/58 (BP Location: Left Arm, Patient Position: Sitting, Cuff Size: Normal)   Pulse 70   Ht 5\' 10"  (1.778 m)   Wt 104.2 kg (229 lb 11.2 oz)   BMI 32.96 kg/m   Constitutional:  Alert and oriented, No acute  distress. HEENT: [place] AT, moist mucus membranes.  Trachea midline, no masses. Cardiovascular: No clubbing, cyanosis, or edema. Respiratory: Normal respiratory effort, no increased work of breathing. GI: Abdomen is soft, nontender, nondistended, no abdominal masses Skin: No rashes, bruises or suspicious lesions. Lymph: No cervical or inguinal adenopathy. Neurologic: Grossly intact, no focal deficits, moving all 4 extremities. Psychiatric: Normal mood and affect.  Laboratory Data: Lab Results  Component Value Date     WBC 8.5 10/03/2015   HGB 13.7 10/03/2015   HCT 40.9 10/03/2015   MCV 86.0 10/03/2015   PLT 242 10/03/2015    Lab Results  Component Value Date   CREATININE 0.91 10/03/2015    No results found for: PSA  No results found for: TESTOSTERONE  No results found for: HGBA1C  Urinalysis No results found for: COLORURINE, APPEARANCEUR, LABSPEC, [place], GLUCOSEU, HGBUR, BILIRUBINUR, KETONESUR, PROTEINUR, UROBILINOGEN, NITRITE, LEUKOCYTESUR   Assessment & Plan:    PSA elevation - I had a long discussion with the patient on the nature of elevated PSA - benign vs malignant causes. We discussed the nature risks and benefits of continued surveillance, other lab tests, imaging as well as prostate biopsy. We discussed the management of prostate cancer might include active surveillance or treatment depending on biopsy findings. All questions answered. He elects to proceed with prostate biopsy.   There are no diagnoses linked to this encounter.  No Follow-up on file.  Festus Aloe, [facility] [address] [place], [place] [address] [phone]

## 2017-03-01 ENCOUNTER — Encounter: Payer: Self-pay | Admitting: Urology

## 2017-03-01 ENCOUNTER — Ambulatory Visit (INDEPENDENT_AMBULATORY_CARE_PROVIDER_SITE_OTHER): Payer: 59 | Admitting: Urology

## 2017-03-01 ENCOUNTER — Other Ambulatory Visit: Payer: Self-pay | Admitting: Urology

## 2017-03-01 VITALS — BP 125/74 | HR 77 | Ht 70.0 in | Wt 231.5 lb

## 2017-03-01 DIAGNOSIS — R972 Elevated prostate specific antigen [PSA]: Secondary | ICD-10-CM

## 2017-03-01 DIAGNOSIS — N4232 Atypical small acinar proliferation of prostate: Secondary | ICD-10-CM | POA: Diagnosis not present

## 2017-03-01 DIAGNOSIS — C61 Malignant neoplasm of prostate: Secondary | ICD-10-CM | POA: Diagnosis not present

## 2017-03-01 MED ORDER — LEVOFLOXACIN 500 MG PO TABS
500.0000 mg | ORAL_TABLET | Freq: Once | ORAL | Status: AC
  Administered 2017-03-01: 500 mg via ORAL

## 2017-03-01 MED ORDER — GENTAMICIN SULFATE 40 MG/ML IJ SOLN
80.0000 mg | Freq: Once | INTRAMUSCULAR | Status: AC
  Administered 2017-03-01: 80 mg via INTRAMUSCULAR

## 2017-03-01 NOTE — Progress Notes (Signed)
HPI: F/u PSA elevation for biopsy. The patient was noted to have an elevated PSA as part of a routine physical exam associated with some slight discomfort and more frequent urination. Treated with abx. PSA rose. UA clear. He also described a weak stream. He denies any dysuria or gross hematuria.  No FH PCa.   PSA history: 11/2011: 2.5 12/20/16: 12.8 01/12/2017 DRE - prostate was noted to be indurated and severely tender. Tried Bactrim.  02/21/2017 PSA 14.8 - UA clear   Prostate Biopsy Procedure   Informed consent was obtained after discussing risks/benefits of the procedure.  A time out was performed to ensure correct patient identity.  Pre-Procedure: - Last PSA Level: No results found for: PSA - Gentamicin given prophylactically - Levaquin 500 mg administered PO -DRE: 30 g prostate, benign - no hard area or nodule  -Transrectal Ultrasound performed revealing a 42.55 gm prostate (maybe a bit smaller than that, he had a small median lobe projection) -No significant hypoechoic lesion  Procedure: - Prostate block performed using 10 cc 1% lidocaine and biopsies taken from sextant areas, a total of 12 under ultrasound guidance.  Post-Procedure: - Patient tolerated the procedure well - He was counseled to seek immediate medical attention if experiences any severe pain, significant bleeding, or fevers - Return in one week to discuss biopsy results

## 2017-03-04 ENCOUNTER — Other Ambulatory Visit: Payer: Self-pay | Admitting: Urology

## 2017-03-04 LAB — PATHOLOGY REPORT

## 2017-03-08 ENCOUNTER — Telehealth: Payer: Self-pay

## 2017-03-08 DIAGNOSIS — C61 Malignant neoplasm of prostate: Secondary | ICD-10-CM

## 2017-03-08 NOTE — Telephone Encounter (Signed)
-----   Message from Festus Aloe, MD sent at 03/08/2017 12:36 PM EDT ----- I called and notified patient of prostate biopsy results.  He needs a bone scan and a CT scan of the pelvis with IV contrast for staging.  After these studies are done follow up with me to go over imaging and biopsy results and make a plan.  I did discuss with him this stage, grade and prognosis and we discussed the nature risk and benefits of active surveillance, surgery plus minus adjuvant/salavage radiation, or radiation with androgen deprivation.  He had good questions and we discussed how each treatment might effect sexual function and urinary function.  He asked if he would need a "colostomy bag" and we discussed this not standard treatment of prostate cancer although it's possible in the very rare case of significant rectal injury with surgery.  We did discuss Foley catheter after surgery.  We'll stage as above and make a plan.   ----- Message ----- From: Royanne Foots, CMA Sent: 03/04/2017   9:17 AM To: Festus Aloe, MD    ----- Message ----- From: Delman Cheadle Sent: 03/04/2017   9:13 AM To: [facility]

## 2017-03-08 NOTE — Telephone Encounter (Signed)
Please schedule patient for tests and follow up, orders have been placed thanks

## 2017-03-09 ENCOUNTER — Other Ambulatory Visit: Payer: Self-pay

## 2017-03-09 DIAGNOSIS — C61 Malignant neoplasm of prostate: Secondary | ICD-10-CM

## 2017-03-15 ENCOUNTER — Ambulatory Visit: Payer: 59

## 2017-03-18 ENCOUNTER — Ambulatory Visit: Admission: RE | Admit: 2017-03-18 | Payer: 59 | Source: Ambulatory Visit

## 2017-03-18 ENCOUNTER — Ambulatory Visit
Admission: RE | Admit: 2017-03-18 | Discharge: 2017-03-18 | Disposition: A | Discharge status: Home / Self Care | Payer: 59 | Source: Ambulatory Visit | Attending: Urology | Admitting: Urology

## 2017-03-18 ENCOUNTER — Encounter
Admission: RE | Admit: 2017-03-18 | Discharge: 2017-03-18 | Disposition: A | Discharge status: Home / Self Care | Payer: 59 | Source: Ambulatory Visit | Attending: Urology | Admitting: Urology

## 2017-03-18 DIAGNOSIS — N492 Inflammatory disorders of scrotum: Secondary | ICD-10-CM | POA: Insufficient documentation

## 2017-03-18 DIAGNOSIS — K469 Unspecified abdominal hernia without obstruction or gangrene: Secondary | ICD-10-CM | POA: Insufficient documentation

## 2017-03-18 DIAGNOSIS — K5730 Diverticulosis of large intestine without perforation or abscess without bleeding: Secondary | ICD-10-CM | POA: Diagnosis not present

## 2017-03-18 DIAGNOSIS — C61 Malignant neoplasm of prostate: Secondary | ICD-10-CM

## 2017-03-18 MED ORDER — TECHNETIUM TC 99M MEDRONATE IV KIT
25.0000 | PACK | Freq: Once | INTRAVENOUS | Status: AC | PRN
  Administered 2017-03-18: 22.85 via INTRAVENOUS

## 2017-03-18 MED ORDER — IOPAMIDOL (ISOVUE-300) INJECTION 61%
100.0000 mL | Freq: Once | INTRAVENOUS | Status: AC | PRN
  Administered 2017-03-18: 100 mL via INTRAVENOUS

## 2017-03-22 ENCOUNTER — Encounter: Payer: Self-pay | Admitting: Urology

## 2017-03-22 ENCOUNTER — Ambulatory Visit (INDEPENDENT_AMBULATORY_CARE_PROVIDER_SITE_OTHER): Payer: 59 | Admitting: Urology

## 2017-03-22 VITALS — BP 102/63 | HR 76 | Ht 70.0 in | Wt 233.8 lb

## 2017-03-22 DIAGNOSIS — C61 Malignant neoplasm of prostate: Secondary | ICD-10-CM | POA: Diagnosis not present

## 2017-03-22 NOTE — Progress Notes (Signed)
03/22/2017 2:27 PM   Ralph Villegas May 25, 1966 [id number]  Referring provider: Lavera Guise, [place], [place] [address]  Chief Complaint  Patient presents with  . Follow-up    CT results    HPI:  Intermediate Risk Prostate Cancer PSA 14.8  T1c Prostate 43 grams Gleason 4+3 = 7, 61%, left base lateral Gleason 4+3 equal 7, 52%, left mid medial Gleason 3+4 = 7, 17%, left mid lateral Gleason 3+4 = 7, 23%, left apex medial Gleason 3+3 = 6, 42%, right mid lateral Suspicious left base medial HGPIN left apex lateral MSK: OC 30%, ECE 67%, LN 10%, SV 10% PFS surgery at 3 yr 52%, 8 yr 36% Staging: CT pelvis benign - fullness of distal left SV; Bone scan - punctate area left orbit, punctate area left posterior ninth rib, right posterior ribs, punctate area right proximal femur. Head CT, bilateral rib series, right hip series recommended.  Patient returns today to discuss biopsy results and staging. He's been well.   PMH: Past Medical History:  Diagnosis Date  . Acid reflux   . Aneurysm [facility])     Surgical History: Past Surgical History:  Procedure Laterality Date  . APPENDECTOMY      Home Medications:  Allergies as of 03/22/2017   No Known Allergies     Medication List       Accurate as of 03/22/17  2:27 PM. Always use your most recent med list.          meloxicam 15 MG tablet Commonly known as:  MOBIC Take 1 tablet (15 mg total) by mouth daily.   tamsulosin 0.4 MG Caps capsule Commonly known as:  FLOMAX Take 1 capsule (0.4 mg total) by mouth daily.       Allergies: No Known Allergies  Family History: Family History  Problem Relation Age of Onset  . Prostate cancer Maternal Uncle   . Kidney cancer Neg Hx   . Bladder Cancer Neg Hx     Social History:  reports that he has never smoked. He has never used smokeless tobacco. He reports that he drinks alcohol. He reports that he does not use drugs.  ROS: UROLOGY Frequent Urination?:  Yes Hard to postpone urination?: No Burning/pain with urination?: No Get up at night to urinate?: Yes Leakage of urine?: No Urine stream starts and stops?: No Trouble starting stream?: No Do you have to strain to urinate?: No Blood in urine?: No Urinary tract infection?: No Sexually transmitted disease?: No Injury to kidneys or bladder?: No Painful intercourse?: No Weak stream?: No Erection problems?: No Penile pain?: No  Gastrointestinal Nausea?: No Vomiting?: No Indigestion/heartburn?: No Diarrhea?: No Constipation?: No  Constitutional Fever: No Night sweats?: No Weight loss?: No Fatigue?: No  Skin Skin rash/lesions?: No Itching?: No  Eyes Blurred vision?: No Double vision?: No  Ears/Nose/Throat Sore throat?: No Sinus problems?: No  Hematologic/Lymphatic Swollen glands?: No Easy bruising?: No  Cardiovascular Leg swelling?: No Chest pain?: No  Respiratory Cough?: No Shortness of breath?: No  Endocrine Excessive thirst?: No  Musculoskeletal Back pain?: No Joint pain?: No  Neurological Headaches?: No Dizziness?: No  Psychologic Depression?: No Anxiety?: No  Physical Exam: BP 102/63 (BP Location: Left Arm, Patient Position: Sitting, Cuff Size: Normal)   Pulse 76   Ht 5\' 10"  (1.778 m)   Wt 106.1 kg (233 lb 12.8 oz)   BMI 33.55 kg/m   Constitutional:  Alert and oriented, No acute distress. HEENT: [place] AT, moist mucus membranes.  Trachea midline, no masses. Cardiovascular: No clubbing, cyanosis, or edema. Respiratory: Normal respiratory effort, no increased work of breathing. GI: Abdomen is soft, nontender, nondistended, no abdominal masses Skin: No rashes, bruises or suspicious lesions Neurologic: Grossly intact, no focal deficits, moving all 4 extremities. Psychiatric: Normal mood and affect.  Laboratory Data: Lab Results  Component Value Date   WBC 8.5 10/03/2015   HGB 13.7 10/03/2015   HCT 40.9 10/03/2015   MCV 86.0 10/03/2015    PLT 242 10/03/2015    Lab Results  Component Value Date   CREATININE 0.91 10/03/2015    No results found for: PSA  No results found for: TESTOSTERONE  No results found for: HGBA1C  Urinalysis    Component Value Date/Time   APPEARANCEUR Clear 02/23/2017 1030   GLUCOSEU Negative 02/23/2017 1030   BILIRUBINUR Negative 02/23/2017 1030   PROTEINUR Negative 02/23/2017 1030   NITRITE Negative 02/23/2017 1030   LEUKOCYTESUR Negative 02/23/2017 1030    Pertinent Imaging:  Assessment & Plan:   Intermediate risk prostate cancer-using the 100 questions book and his path report as a reference I discussed with the patient and his wife I had a long discussion with the patient using the Living with Prostate Cancer booklet. We went his stage, grade and prognosis and the relevant anatomy. We discussed the nature risks and benefits of active surveillance, radical prostatectomy, IMRT (+/- brachytherapy, +/- ADT). We discussed specifically how each treatment might affect the bowel, bladder and sexual function. We discussed how each treatment might effect salvage treatments. We discussed the role of other modalities in the treatment of prostate cancer including chemotherapy, HIFU and cryotherapy. We discussed the [place] and that after 5 years about 50% of guys might have PSA recurrence with this type of prostate cancer need radiation but on the flip side about 50% may need surgery alone. They'll consider.  His bone scan had some focal areas in the head, ribs and right hip and I ordered the CT of the head, rib series and hip series x-rays. I'll have him see Dr. Baruch Gouty to discuss radiation techniques for prostate cancer and I'll have him follow-up with Dr. Erlene Quan to discuss surgery. They asked about a second opinion and I certainly encouraged it if that something they like to do.  There are no diagnoses linked to this encounter.  No Follow-up on file.  Festus Aloe,  [facility] [address] [place], [place] [address] [phone]

## 2017-04-04 ENCOUNTER — Encounter: Payer: Self-pay | Admitting: Radiation Oncology

## 2017-04-04 ENCOUNTER — Encounter (INDEPENDENT_AMBULATORY_CARE_PROVIDER_SITE_OTHER): Payer: Self-pay

## 2017-04-04 ENCOUNTER — Ambulatory Visit
Admission: RE | Admit: 2017-04-04 | Discharge: 2017-04-04 | Disposition: A | Discharge status: Home / Self Care | Payer: 59 | Source: Ambulatory Visit | Attending: Radiation Oncology | Admitting: Radiation Oncology

## 2017-04-04 VITALS — BP 115/75 | HR 83 | Temp 98.0°F | Resp 16 | Wt 233.7 lb

## 2017-04-04 DIAGNOSIS — C61 Malignant neoplasm of prostate: Secondary | ICD-10-CM | POA: Diagnosis not present

## 2017-04-04 DIAGNOSIS — K219 Gastro-esophageal reflux disease without esophagitis: Secondary | ICD-10-CM | POA: Diagnosis not present

## 2017-04-04 DIAGNOSIS — Z8042 Family history of malignant neoplasm of prostate: Secondary | ICD-10-CM | POA: Insufficient documentation

## 2017-04-04 DIAGNOSIS — R351 Nocturia: Secondary | ICD-10-CM | POA: Diagnosis not present

## 2017-04-04 DIAGNOSIS — C7951 Secondary malignant neoplasm of bone: Secondary | ICD-10-CM | POA: Insufficient documentation

## 2017-04-04 DIAGNOSIS — Z8669 Personal history of other diseases of the nervous system and sense organs: Secondary | ICD-10-CM | POA: Insufficient documentation

## 2017-04-04 NOTE — Consult Note (Signed)
NEW PATIENT EVALUATION  Name: Ralph Villegas  MRN: [id number]  Date:   04/04/2017     DOB: 27-May-1966   This 51 y.o. male patient presents to the clinic for initial evaluation of high intermediate risk prostate cancer stage IIa presenting with a PSA of 14.8 with large volume disease.  REFERRING PHYSICIAN: Lavera Guise, MD  CHIEF COMPLAINT:  Chief Complaint  Patient presents with  . Prostate Cancer    DIAGNOSIS: The encounter diagnosis was Malignant neoplasm of prostate ([place]).   PREVIOUS INVESTIGATIONS:  Pathology reports reviewed Bone scan and CT scans reviewed Clinical notes reviewed  HPI: Patient is a 51 year old male who presented with a PSA of 14.8 which prompted urology consult and transrectal ultrasound-guided biopsy. 6 out of 12 core biopsies were positive for adenocarcinoma highest being grade 4+3. Bone scan was performed showing punctate area of increased activity over the left orbit as well as some areas involvement of the ribs. Also an area of increased uptake in the right proximal femur. CT scan of the abdomen and pelvis showed normal prostate size with no evidence of pelvic adenopathy. Also no evidence of metastatic disease. Patient does have a CT scan of the head with contrast ordered to evaluate the orbital lesion. Patient does have nocturia 2 some mild frequency. He's had discussions of treatment options with urology and is now referred to radiation oncology for consideration of treatment.  PLANNED TREATMENT REGIMEN: Probable prostatectomy  PAST MEDICAL HISTORY:  has a past medical history of Acid reflux and Aneurysm ([place]).    PAST SURGICAL HISTORY:  Past Surgical History:  Procedure Laterality Date  . APPENDECTOMY      FAMILY HISTORY: family history includes Prostate cancer in his maternal uncle.  SOCIAL HISTORY:  reports that he has never smoked. He has never used smokeless tobacco. He reports that he drinks alcohol. He reports that he does not use  drugs.  ALLERGIES: Patient has no known allergies.  MEDICATIONS:  No current outpatient prescriptions on file.   No current facility-administered medications for this encounter.     ECOG PERFORMANCE STATUS:  0 - Asymptomatic  REVIEW OF SYSTEMS:  Patient denies any weight loss, fatigue, weakness, fever, chills or night sweats. Patient denies any loss of vision, blurred vision. Patient denies any ringing  of the ears or hearing loss. No irregular heartbeat. Patient denies heart murmur or history of fainting. Patient denies any chest pain or pain radiating to her upper extremities. Patient denies any shortness of breath, difficulty breathing at night, cough or hemoptysis. Patient denies any swelling in the lower legs. Patient denies any nausea vomiting, vomiting of blood, or coffee ground material in the vomitus. Patient denies any stomach pain. Patient states has had normal bowel movements no significant constipation or diarrhea. Patient denies any dysuria, hematuria or significant nocturia. Patient denies any problems walking, swelling in the joints or loss of balance. Patient denies any skin changes, loss of hair or loss of weight. Patient denies any excessive worrying or anxiety or significant depression. Patient denies any problems with insomnia. Patient denies excessive thirst, polyuria, polydipsia. Patient denies any swollen glands, patient denies easy bruising or easy bleeding. Patient denies any recent infections, allergies or URI. Patient "s visual fields have not changed significantly in recent time.    PHYSICAL EXAM: BP 115/75 (BP Location: Right Arm, Patient Position: Sitting, Cuff Size: Large)   Pulse 83   Temp 98 F (36.7 C) (Tympanic)   Resp 16   Wt 233 lb  11 oz (106 kg)   BMI 33.53 kg/m  On rectal exam rectal sphincter tone is good prostate is smooth without evidence of nodularity or mass. Sulcus is preserved bilaterally. Well-developed well-nourished patient in NAD. HEENT  reveals PERLA, EOMI, discs not visualized.  Oral cavity is clear. No oral mucosal lesions are identified. Neck is clear without evidence of cervical or supraclavicular adenopathy. Lungs are clear to A&P. Cardiac examination is essentially unremarkable with regular rate and rhythm without murmur rub or thrill. Abdomen is benign with no organomegaly or masses noted. Motor sensory and DTR levels are equal and symmetric in the upper and lower extremities. Cranial nerves II through XII are grossly intact. Proprioception is intact. No peripheral adenopathy or edema is identified. No motor or sensory levels are noted. Crude visual fields are within normal range.  LABORATORY DATA: Pathology reports reviewed    RADIOLOGY RESULTS: Bone scan and CT scans reviewed   IMPRESSION: Stage IIa adenocarcinoma the prostate in 51 year old male presenting with a PSA of 14.8 with Gleason 7 (4+3)  PLAN: At this time I got over treatment options with the patient including watchful waiting which I would definitely not endorse radiation therapy as well as prostatectomy. Based on his young age and large volume of disease believe prostatectomy would be his best and [facility] with [facility] showing approximately 50% chance he may need adjuvant treatment after prostatectomy. Risks and benefits of options of treatment including prostatectomy and radiation therapy were discussed in detail with the patient and his wife. I believe the patient is leaning towards prostatectomy. We'll be happy to reevaluate patient should he have biochemical failure or indication that he would need postoperative treatment such as positive surgical margin or lymph node involvement. Patient wife copy of my treatment plan well.  I would like to take this opportunity to thank you for allowing me to participate in the care of your patient.Armstead Peaks., MD

## 2017-04-04 NOTE — Progress Notes (Signed)
Patient here for initial visit. No complaints today. 

## 2017-04-11 ENCOUNTER — Ambulatory Visit
Admission: RE | Admit: 2017-04-11 | Discharge: 2017-04-11 | Disposition: A | Discharge status: Home / Self Care | Payer: 59 | Source: Ambulatory Visit | Attending: Urology | Admitting: Urology

## 2017-04-11 ENCOUNTER — Other Ambulatory Visit: Payer: Self-pay | Admitting: Urology

## 2017-04-11 ENCOUNTER — Ambulatory Visit: Admit: 2017-04-11 | Payer: Self-pay

## 2017-04-11 DIAGNOSIS — R52 Pain, unspecified: Secondary | ICD-10-CM | POA: Diagnosis not present

## 2017-04-11 DIAGNOSIS — R0781 Pleurodynia: Secondary | ICD-10-CM | POA: Diagnosis not present

## 2017-04-11 DIAGNOSIS — C61 Malignant neoplasm of prostate: Secondary | ICD-10-CM | POA: Diagnosis not present

## 2017-04-11 DIAGNOSIS — M899 Disorder of bone, unspecified: Secondary | ICD-10-CM | POA: Insufficient documentation

## 2017-04-11 DIAGNOSIS — M25551 Pain in right hip: Secondary | ICD-10-CM | POA: Diagnosis not present

## 2017-04-11 DIAGNOSIS — R0789 Other chest pain: Secondary | ICD-10-CM | POA: Diagnosis not present

## 2017-04-11 DIAGNOSIS — R51 Headache: Secondary | ICD-10-CM | POA: Diagnosis not present

## 2017-04-12 ENCOUNTER — Telehealth: Payer: Self-pay | Admitting: Family Medicine

## 2017-04-12 NOTE — Telephone Encounter (Signed)
Patient notified

## 2017-04-12 NOTE — Telephone Encounter (Signed)
-----   Message from Festus Aloe, MD sent at 04/12/2017  9:07 AM EDT ----- Notify pt: CT head was negative -- no sign of prostate cancer in orbit or skull   Caryl Pina  - same gut that sees you tomorrow     ----- Message ----- From: Orlene Erm, CMA Sent: 04/12/2017   8:17 AM To: Festus Aloe, MD    ----- Message ----- From: Interface, Rad Results In Sent: 04/11/2017   6:59 PM To: Rowe Audiel [facility]

## 2017-04-12 NOTE — Telephone Encounter (Signed)
-----   Message from Festus Aloe, MD sent at 04/12/2017  9:04 AM EDT ----- Notify patient areas in hip and ribs may be related to prostate cancer but not definite. F/u with Dr. Erlene Quan tomorrow to discuss prostate removal and these areas and PSA can be followed after surgery.    ----- Message ----- From: Orlene Erm, CMA Sent: 04/12/2017   8:16 AM To: Festus Aloe, MD    ----- Message ----- From: Interface, Rad Results In Sent: 04/11/2017   7:40 PM To: Rowe Demarrion Clinical

## 2017-04-12 NOTE — Telephone Encounter (Signed)
Will discuss at appointment.

## 2017-04-13 ENCOUNTER — Encounter: Payer: Self-pay | Admitting: Urology

## 2017-04-13 ENCOUNTER — Ambulatory Visit (INDEPENDENT_AMBULATORY_CARE_PROVIDER_SITE_OTHER): Payer: 59 | Admitting: Urology

## 2017-04-13 VITALS — BP 118/77 | HR 80 | Ht 70.0 in | Wt 235.0 lb

## 2017-04-13 DIAGNOSIS — E669 Obesity, unspecified: Secondary | ICD-10-CM | POA: Diagnosis not present

## 2017-04-13 DIAGNOSIS — C61 Malignant neoplasm of prostate: Secondary | ICD-10-CM

## 2017-04-13 DIAGNOSIS — M899 Disorder of bone, unspecified: Secondary | ICD-10-CM
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
## 2017-04-29 DIAGNOSIS — C61 Malignant neoplasm of prostate: Secondary | ICD-10-CM | POA: Diagnosis not present

## 2017-04-29 DIAGNOSIS — C413 Malignant neoplasm of ribs, sternum and clavicle: Secondary | ICD-10-CM | POA: Diagnosis not present

## 2017-06-06 DIAGNOSIS — I671 Cerebral aneurysm, nonruptured: Secondary | ICD-10-CM | POA: Diagnosis not present

## 2017-06-14 DIAGNOSIS — C61 Malignant neoplasm of prostate: Secondary | ICD-10-CM | POA: Diagnosis not present

## 2017-06-24 DIAGNOSIS — I671 Cerebral aneurysm, nonruptured: Secondary | ICD-10-CM | POA: Diagnosis not present

## 2017-06-24 DIAGNOSIS — C61 Malignant neoplasm of prostate: Secondary | ICD-10-CM | POA: Diagnosis not present

## 2017-06-24 DIAGNOSIS — K219 Gastro-esophageal reflux disease without esophagitis: Secondary | ICD-10-CM | POA: Diagnosis not present

## 2017-06-29 DIAGNOSIS — K219 Gastro-esophageal reflux disease without esophagitis: Secondary | ICD-10-CM | POA: Diagnosis not present

## 2017-06-29 DIAGNOSIS — C61 Malignant neoplasm of prostate: Secondary | ICD-10-CM | POA: Diagnosis not present

## 2017-06-29 DIAGNOSIS — I671 Cerebral aneurysm, nonruptured: Secondary | ICD-10-CM | POA: Diagnosis not present

## 2017-06-29 DIAGNOSIS — R937 Abnormal findings on diagnostic imaging of other parts of musculoskeletal system: Secondary | ICD-10-CM | POA: Diagnosis not present

## 2017-07-03 DIAGNOSIS — R918 Other nonspecific abnormal finding of lung field: Secondary | ICD-10-CM | POA: Diagnosis not present

## 2017-07-03 DIAGNOSIS — J90 Pleural effusion, not elsewhere classified: Secondary | ICD-10-CM | POA: Diagnosis not present

## 2017-07-03 DIAGNOSIS — K5900 Constipation, unspecified: Secondary | ICD-10-CM | POA: Diagnosis not present

## 2017-07-03 DIAGNOSIS — C61 Malignant neoplasm of prostate: Secondary | ICD-10-CM | POA: Diagnosis not present

## 2017-07-03 DIAGNOSIS — R05 Cough: Secondary | ICD-10-CM | POA: Diagnosis not present

## 2017-07-03 DIAGNOSIS — R140 Abdominal distension (gaseous): Secondary | ICD-10-CM | POA: Diagnosis not present

## 2017-07-03 DIAGNOSIS — N1330 Unspecified hydronephrosis: Secondary | ICD-10-CM | POA: Diagnosis not present

## 2017-07-03 DIAGNOSIS — R102 Pelvic and perineal pain: Secondary | ICD-10-CM | POA: Diagnosis not present

## 2017-07-06 DIAGNOSIS — Z9889 Other specified postprocedural states: Secondary | ICD-10-CM | POA: Diagnosis not present

## 2017-07-06 DIAGNOSIS — R112 Nausea with vomiting, unspecified: Secondary | ICD-10-CM | POA: Diagnosis not present

## 2017-07-12 DIAGNOSIS — C61 Malignant neoplasm of prostate: Secondary | ICD-10-CM | POA: Diagnosis not present

## 2017-07-19 DIAGNOSIS — C61 Malignant neoplasm of prostate: Secondary | ICD-10-CM | POA: Diagnosis not present

## 2017-08-08 DIAGNOSIS — C61 Malignant neoplasm of prostate: Secondary | ICD-10-CM | POA: Diagnosis not present

## 2017-09-19 DIAGNOSIS — C61 Malignant neoplasm of prostate: Secondary | ICD-10-CM | POA: Diagnosis not present

## 2017-10-04 DIAGNOSIS — Z9079 Acquired absence of other genital organ(s): Secondary | ICD-10-CM | POA: Diagnosis not present

## 2017-10-04 DIAGNOSIS — Z8546 Personal history of malignant neoplasm of prostate: Secondary | ICD-10-CM | POA: Diagnosis not present

## 2017-10-04 DIAGNOSIS — Z08 Encounter for follow-up examination after completed treatment for malignant neoplasm: Secondary | ICD-10-CM | POA: Diagnosis not present

## 2017-10-04 DIAGNOSIS — C61 Malignant neoplasm of prostate: Secondary | ICD-10-CM | POA: Diagnosis not present

## 2017-10-04 DIAGNOSIS — K219 Gastro-esophageal reflux disease without esophagitis: Secondary | ICD-10-CM | POA: Diagnosis not present

## 2017-11-07 DIAGNOSIS — C61 Malignant neoplasm of prostate: Secondary | ICD-10-CM | POA: Diagnosis not present

## 2017-11-07 DIAGNOSIS — R232 Flushing: Secondary | ICD-10-CM | POA: Diagnosis not present

## 2017-12-19 DIAGNOSIS — Z9079 Acquired absence of other genital organ(s): Secondary | ICD-10-CM | POA: Diagnosis not present

## 2017-12-19 DIAGNOSIS — C61 Malignant neoplasm of prostate: Secondary | ICD-10-CM | POA: Diagnosis not present

## 2018-02-20 DIAGNOSIS — C61 Malignant neoplasm of prostate: Secondary | ICD-10-CM | POA: Diagnosis not present

## 2018-02-27 DIAGNOSIS — R232 Flushing: Secondary | ICD-10-CM | POA: Diagnosis not present

## 2018-02-27 DIAGNOSIS — Z79818 Long term (current) use of other agents affecting estrogen receptors and estrogen levels: Secondary | ICD-10-CM | POA: Diagnosis not present

## 2018-02-27 DIAGNOSIS — C61 Malignant neoplasm of prostate: Secondary | ICD-10-CM | POA: Diagnosis not present

## 2018-06-05 DIAGNOSIS — Z79818 Long term (current) use of other agents affecting estrogen receptors and estrogen levels: Secondary | ICD-10-CM | POA: Diagnosis not present

## 2018-06-05 DIAGNOSIS — C61 Malignant neoplasm of prostate: Secondary | ICD-10-CM | POA: Diagnosis not present

## 2018-06-05 DIAGNOSIS — R232 Flushing: Secondary | ICD-10-CM | POA: Diagnosis not present

## 2018-06-12 DIAGNOSIS — I671 Cerebral aneurysm, nonruptured: Secondary | ICD-10-CM | POA: Diagnosis not present

## 2018-06-12 DIAGNOSIS — I729 Aneurysm of unspecified site: Secondary | ICD-10-CM | POA: Diagnosis not present

## 2018-08-18 ENCOUNTER — Other Ambulatory Visit: Payer: Self-pay

## 2018-08-18 ENCOUNTER — Encounter: Payer: Self-pay | Admitting: Emergency Medicine

## 2018-08-18 ENCOUNTER — Emergency Department
Admission: EM | Admit: 2018-08-18 | Discharge: 2018-08-18 | Disposition: A | Discharge status: Home / Self Care | Payer: 59 | Attending: Emergency Medicine | Admitting: Emergency Medicine

## 2018-08-18 DIAGNOSIS — Y929 Unspecified place or not applicable: Secondary | ICD-10-CM | POA: Diagnosis not present

## 2018-08-18 DIAGNOSIS — X58XXXA Exposure to other specified factors, initial encounter: Secondary | ICD-10-CM | POA: Insufficient documentation

## 2018-08-18 DIAGNOSIS — Z85830 Personal history of malignant neoplasm of bone: Secondary | ICD-10-CM | POA: Diagnosis not present

## 2018-08-18 DIAGNOSIS — T1591XA Foreign body on external eye, part unspecified, right eye, initial encounter: Secondary | ICD-10-CM | POA: Diagnosis not present

## 2018-08-18 DIAGNOSIS — Y9389 Activity, other specified: Secondary | ICD-10-CM | POA: Diagnosis not present

## 2018-08-18 DIAGNOSIS — S0551XA Penetrating wound with foreign body of right eyeball, initial encounter: Secondary | ICD-10-CM

## 2018-08-18 DIAGNOSIS — T1581XA Foreign body in other and multiple parts of external eye, right eye, initial encounter: Secondary | ICD-10-CM | POA: Diagnosis not present

## 2018-08-18 DIAGNOSIS — T1501XA Foreign body in cornea, right eye, initial encounter: Secondary | ICD-10-CM | POA: Diagnosis not present

## 2018-08-18 DIAGNOSIS — Y999 Unspecified external cause status: Secondary | ICD-10-CM | POA: Insufficient documentation

## 2018-08-18 DIAGNOSIS — Z79899 Other long term (current) drug therapy: Secondary | ICD-10-CM | POA: Diagnosis not present

## 2018-08-18 HISTORY — DX: Malignant neoplasm of bone and articular cartilage, unspecified: C41.9

## 2018-08-18 MED ORDER — EYE WASH OPHTH SOLN
OPHTHALMIC | Status: AC
  Filled 2018-08-18: qty 118

## 2018-08-18 MED ORDER — FLUORESCEIN SODIUM 1 MG OP STRP
ORAL_STRIP | OPHTHALMIC | Status: AC
  Filled 2018-08-18: qty 1

## 2018-08-18 MED ORDER — TETRACAINE HCL 0.5 % OP SOLN
OPHTHALMIC | Status: AC
  Filled 2018-08-18: qty 4

## 2018-08-18 NOTE — ED Provider Notes (Signed)
[facility] Emergency Department Provider Note  _______________   First MD Initiated Contact with Patient 08/18/18 1059     (approximate)  I have reviewed the triage vital signs and the nursing notes.   HISTORY  Chief Complaint Foreign Body in Eye    HPI Ralph Villegas is a 52 y.o. male patient complained of metallic foreign body right eye for 2 days.  Patient that he was using a grinder and thought his eyeglasses were protected him from the foreign bodies.  Patient went to urgent care clinic this morning and was told to come to the emergency room for definitive evaluation and treatment.  Patient denies vision disturbance.  Patient rates his eye pain discomfort as 8/10.  Patient described the pain is "achy".  No palliative measures for complaint.  Past Medical History:  Diagnosis Date  . Acid reflux   . Aneurysm ([place])   . Bone cancer ([place])     There are no active problems to display for this patient.   Past Surgical History:  Procedure Laterality Date  . APPENDECTOMY      Prior to Admission medications   Medication Sig Start Date End Date Taking? Authorizing Provider  predniSONE (DELTASONE) 5 MG tablet Take 5 mg by mouth daily with breakfast.   Yes [provider]    Allergies Patient has no known allergies.  Family History  Problem Relation Age of Onset  . Prostate cancer Maternal Uncle   . Kidney cancer Neg Hx   . Bladder Cancer Neg Hx     Social History Social History   Tobacco Use  . Smoking status: Never Smoker  . Smokeless tobacco: Never Used  Substance Use Topics  . Alcohol use: Yes  . Drug use: No    Review of Systems Constitutional: No fever/chills Eyes: No visual changes.  Foreign body  right eye. ENT: No sore throat. Cardiovascular: Denies chest pain. Respiratory: Denies shortness of breath. Gastrointestinal: No abdominal pain.  No nausea, no vomiting.  No diarrhea.  No constipation. Genitourinary:  Negative for dysuria. Musculoskeletal: Negative for back pain. Skin: Negative for rash. Neurological: Negative for headaches, focal weakness or numbness.   ____________________________________________   PHYSICAL EXAM:  VITAL SIGNS: ED Triage Vitals  Enc Vitals Group     BP 08/18/18 1105 125/73     Pulse Rate 08/18/18 1105 61     Resp 08/18/18 1105 16     Temp 08/18/18 1105 97.8 F (36.6 C)     Temp Source 08/18/18 1105 Oral     SpO2 08/18/18 1105 100 %     Weight 08/18/18 1052 239 lb (108.4 kg)     Height 08/18/18 1052 5\' 10"  (1.778 m)     Head Circumference --      Peak Flow --      Pain Score 08/18/18 1052 8     Pain Loc --      Pain Edu? --      Excl. in [place]? --    Constitutional: Alert and oriented. Well appearing and in no acute distress. Eyes: Conjunctivae are normal. PERRL. EOMI. fluoroscopy stain reveal metallic foreign body right eye. Cardiovascular: Normal rate, regular rhythm. Grossly normal heart sounds.  Good peripheral circulation. Respiratory: Normal respiratory effort.  No retractions. Lungs CTAB. Skin:  Skin is warm, dry and intact. No rash noted. Psychiatric: Mood and affect are normal. Speech and behavior are normal.  ____________________________________________   LABS (all labs ordered are listed, but only abnormal  results are displayed)  Labs Reviewed - No data to display ____________________________________________  EKG   ____________________________________________  RADIOLOGY  ED MD interpretation:    Official radiology report(s): No results found.  ____________________________________________   PROCEDURES  Procedure(s) performed: None  Procedures  Critical Care performed: No  ____________________________________________   INITIAL IMPRESSION / ASSESSMENT AND PLAN / ED COURSE  As part of my medical decision making, I reviewed the following data within the [place]    Patient presents with 3-day  history of foreign body right eye.  Patient was sent from urgent care clinic status post attempt to remove foreign body.  I was unable to remove foreign body and discussed patient with on-call ophthalmologist.  Patient will be seen at 1245 this afternoon at the [facility].      ____________________________________________   FINAL CLINICAL IMPRESSION(S) / ED DIAGNOSES  Final diagnoses:  Foreign body in lens of right eye, initial encounter     ED Discharge Orders    None       Note:  This document was prepared using Dragon voice recognition software and may include unintentional dictation errors.    Sable Feil, PA-C 08/18/18 [place], [place], MD 08/21/18 2330

## 2018-08-18 NOTE — ED Triage Notes (Signed)
usine a grinder at home and got piece of metal in eye Wednesday.  Went to walk in and they sent him here to have it removed.

## 2018-08-18 NOTE — ED Notes (Signed)
Visual acuity Right 20/30   Left 20/20

## 2018-08-18 NOTE — Discharge Instructions (Addendum)
You have an appointment with the eye doctor at 12:45 PM today.  Please go to the side and not the front entrance and knocked on the door.

## 2018-08-21 DIAGNOSIS — T1501XD Foreign body in cornea, right eye, subsequent encounter: Secondary | ICD-10-CM | POA: Diagnosis not present

## 2018-08-28 DIAGNOSIS — C61 Malignant neoplasm of prostate: Secondary | ICD-10-CM | POA: Diagnosis not present

## 2018-08-28 DIAGNOSIS — C7951 Secondary malignant neoplasm of bone: Secondary | ICD-10-CM | POA: Diagnosis not present

## 2018-08-28 DIAGNOSIS — N393 Stress incontinence (female) (male): Secondary | ICD-10-CM | POA: Diagnosis not present

## 2018-09-04 DIAGNOSIS — R232 Flushing: Secondary | ICD-10-CM | POA: Diagnosis not present

## 2018-09-04 DIAGNOSIS — C61 Malignant neoplasm of prostate: Secondary | ICD-10-CM | POA: Diagnosis not present

## 2018-09-04 DIAGNOSIS — Z79818 Long term (current) use of other agents affecting estrogen receptors and estrogen levels: Secondary | ICD-10-CM | POA: Diagnosis not present

## 2018-12-04 DIAGNOSIS — C61 Malignant neoplasm of prostate: Secondary | ICD-10-CM | POA: Diagnosis not present

## 2018-12-04 DIAGNOSIS — C7951 Secondary malignant neoplasm of bone: Secondary | ICD-10-CM | POA: Diagnosis not present

## 2018-12-04 DIAGNOSIS — Z79818 Long term (current) use of other agents affecting estrogen receptors and estrogen levels: Secondary | ICD-10-CM | POA: Diagnosis not present

## 2019-04-06 ENCOUNTER — Other Ambulatory Visit: Payer: Self-pay

## 2019-04-06 DIAGNOSIS — Z20822 Contact with and (suspected) exposure to covid-19: Secondary | ICD-10-CM

## 2019-04-11 LAB — NOVEL CORONAVIRUS, NAA: SARS-CoV-2, NAA: NOT DETECTED

## 2019-05-03 ENCOUNTER — Encounter: Payer: Self-pay | Admitting: Nurse Practitioner

## 2019-05-03 ENCOUNTER — Ambulatory Visit: Payer: 59 | Admitting: Nurse Practitioner

## 2019-05-03 ENCOUNTER — Other Ambulatory Visit: Payer: Self-pay

## 2019-05-03 VITALS — Temp 97.8°F | Ht 70.0 in | Wt 240.0 lb

## 2019-05-03 DIAGNOSIS — J029 Acute pharyngitis, unspecified: Secondary | ICD-10-CM | POA: Insufficient documentation

## 2019-05-03 MED ORDER — AMOXICILLIN-POT CLAVULANATE 875-125 MG PO TABS: 1.0000 | ORAL_TABLET | Freq: Two times a day (BID) | ORAL | 0 refills | Status: DC

## 2019-05-03 MED ORDER — FIRST-DUKES MOUTHWASH MT SUSP: OROMUCOSAL | 0 refills | Status: DC

## 2019-05-03 NOTE — Progress Notes (Signed)
[facility] [place], [place] [address]  Internal MEDICINE  Telephone Visit  Patient Name: Ralph Villegas  [id number]  [id number]  Date of Service: 05/03/2019  I connected with the patient at 12:41 by webcam and verified the patients identity using two identifiers.   I discussed the limitations, risks, security and privacy concerns of performing an evaluation and management service by webcam and the availability of in person appointments. I also discussed with the patient that there may be a patient responsible charge related to the service.  The patient expressed understanding and agrees to proceed.    Chief Complaint  Patient presents with  . Telephone Assessment  . Telephone Screen  . Cough    coughedup some blood yesterday  . Sore Throat    swollen and swollen uvula and white spots on the back of the throat, hard time swallowing  . Shortness of Breath  . Fever    pt is not sure if his thermomemter is working well, he felt warmer yesterday than he did today    The patient has been contacted via webcam for follow up visit due to concerns for spread of novel coronavirus. The patient states that he has severe sore throat, swollen uvula, tonsils enlarged. He has headache and cough. Cough is blood tinged but feels as though this is coming from tonsils. Has had covid test a few weeks ago which was negative. No new exposure to his knowledge.       Current Medication: Outpatient Encounter Medications as of 05/03/2019  Medication Sig  . abiraterone acetate (ZYTIGA) 500 MG tablet Take 1,000 mg by mouth daily. Take on an empty stomach 1 hour before or 2 hours after a meal.  . predniSONE (DELTASONE) 5 MG tablet Take 5 mg by mouth daily with breakfast.  . venlafaxine (EFFEXOR) 37.5 MG tablet Take 37.5 mg by mouth daily.  Marland Kitchen amoxicillin-clavulanate (AUGMENTIN) 875-125 MG tablet Take 1 tablet by mouth 2 (two) times daily.  . Diphenhyd-Hydrocort-Nystatin (FIRST-DUKES  MOUTHWASH) SUSP Swish and swallow with 30mls 3 to 4 times daily prn sore throat   No facility-administered encounter medications on file as of 05/03/2019.     Surgical History: Past Surgical History:  Procedure Laterality Date  . APPENDECTOMY      Medical History: Past Medical History:  Diagnosis Date  . Acid reflux   . Aneurysm ([place])   . Bone cancer ([place])     Family History: Family History  Problem Relation Age of Onset  . Prostate cancer Maternal Uncle   . Kidney cancer Neg Hx   . Bladder Cancer Neg Hx     Social History   Socioeconomic History  . Marital status: Married    Spouse name: Not on file  . Number of children: Not on file  . Years of education: Not on file  . Highest education level: Not on file  Occupational History  . Not on file  Social Needs  . Financial resource strain: Not on file  . Food insecurity    Worry: Not on file    Inability: Not on file  . Transportation needs    Medical: Not on file    Non-medical: Not on file  Tobacco Use  . Smoking status: Never Smoker  . Smokeless tobacco: Never Used  Substance and Sexual Activity  . Alcohol use: Yes    Comment: little  . Drug use: No  . Sexual activity: Not on file  Lifestyle  . Physical activity  Days per week: Not on file    Minutes per session: Not on file  . Stress: Not on file  Relationships  . Social Herbalist on phone: Not on file    Gets together: Not on file    Attends religious service: Not on file    Active member of club or organization: Not on file    Attends meetings of clubs or organizations: Not on file    Relationship status: Not on file  . Intimate partner violence    Fear of current or ex partner: Not on file    Emotionally abused: Not on file    Physically abused: Not on file    Forced sexual activity: Not on file  Other Topics Concern  . Not on file  Social History Narrative  . Not on file      Review of Systems  Constitutional: Positive  for chills and fatigue. Negative for fever and unexpected weight change.  HENT: Positive for postnasal drip, sore throat, trouble swallowing and voice change. Negative for congestion, rhinorrhea and sneezing.   Respiratory: Positive for cough. Negative for chest tightness and shortness of breath.   Cardiovascular: Negative for chest pain and palpitations.  Gastrointestinal: Positive for nausea. Negative for abdominal pain, constipation, diarrhea and vomiting.  Musculoskeletal: Positive for myalgias. Negative for arthralgias, back pain, joint swelling and neck pain.  Skin: Negative for rash.  Neurological: Positive for headaches. Negative for tremors and numbness.  Hematological: Negative for adenopathy. Does not bruise/bleed easily.  Psychiatric/Behavioral: Negative for behavioral problems (Depression), sleep disturbance and suicidal ideas. The patient is not nervous/anxious.    Observation/Objective:  The patient is alert and oriented. He is pleasant and answering all questions appropriately. Breathing is non-labored. He is in no acute distress.  He is having trouble swallowing, managing secretions. Congested cough present. He appears to be feeling poorly.    Assessment/Plan:  1. Acute pharyngitis, unspecified etiology Start augmentin BID for 10 days. Rest and increase fluids. Take OTC tylenol or NSAIDs to reduce pain and fever.  - amoxicillin-clavulanate (AUGMENTIN) 875-125 MG tablet; Take 1 tablet by mouth 2 (two) times daily.  Dispense: 20 tablet; Refill: 0  2. Sore throat Swish and swallow with Duke's magic mouthwash to help relieve sore throat.  - Diphenhyd-Hydrocort-Nystatin (FIRST-DUKES MOUTHWASH) SUSP; Swish and swallow with 76mls 3 to 4 times daily prn sore throat  Dispense: 200 mL; Refill: 0  General Counseling: Maya verbalizes understanding of the findings of today's phone visit and agrees with plan of treatment. I have discussed any further diagnostic evaluation that may be  needed or ordered today. We also reviewed his medications today. he has been encouraged to call the office with any questions or concerns that should arise related to todays visit.  Rest and increase fluids. Continue using OTC medication to control symptoms.   This patient was seen by [facility] with Dr Lavera Guise as a part of collaborative care agreement  Meds ordered this encounter  Medications  . amoxicillin-clavulanate (AUGMENTIN) 875-125 MG tablet    Sig: Take 1 tablet by mouth 2 (two) times daily.    Dispense:  20 tablet    Refill:  0    Order Specific Question:   Supervising Provider    Answer:   Lavera Guise [[id number]]  . Diphenhyd-Hydrocort-Nystatin (FIRST-DUKES MOUTHWASH) SUSP    Sig: Swish and swallow with 99mls 3 to 4 times daily prn sore throat    Dispense:  200 mL    Refill:  0    Order Specific Question:   Supervising Provider    Answer:   Lavera Guise [[id number]]    Time spent: 67 Minutes    Dr Lavera Guise Internal medicine

## 2019-05-14 IMAGING — NM NM BONE WHOLE BODY
2 series · 12 of 12 positions shown · non-contrast
Comparison: CT 03/18/2017. MRI 11/19/2016. Chest x-ray report
02/24/2015.

CLINICAL DATA: Prostate cancer.

EXAM:
NUCLEAR MEDICINE WHOLE BODY BONE SCAN
TECHNIQUE: Whole body anterior and posterior images were obtained approximately
3 hours after intravenous injection of radiopharmaceutical.
RADIOPHARMACEUTICALS:  22.85 mCi Zechnetium-NNm MDP IV

[Series 1000: 3 hr wholebody · 2.40mm/px · 2 of 2 frames shown]
[frame 1/2]
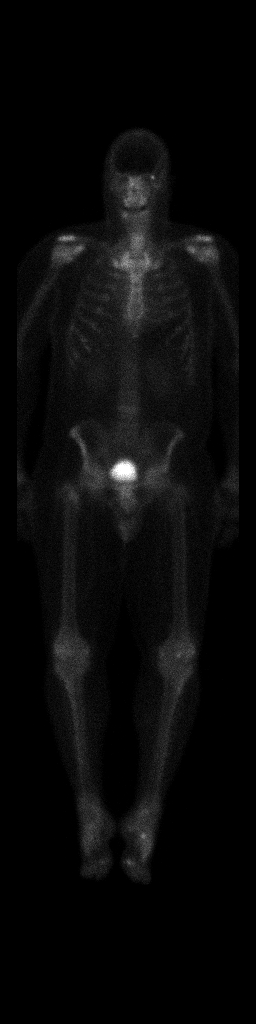
[frame 2/2]
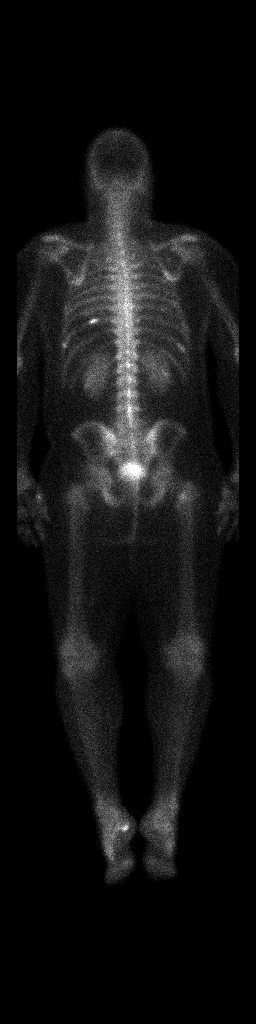

[Series 1000: statics · 2.40mm/px · 5 acquisitions, 10 frames shown]
[im 1/5]
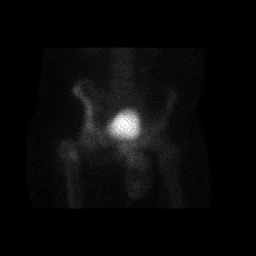
[im 1/5]
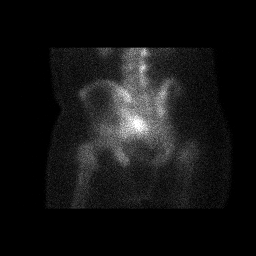
[im 2/5]
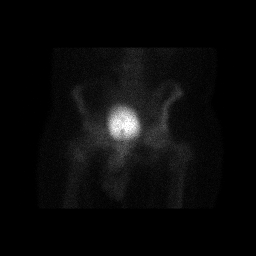
[im 2/5]
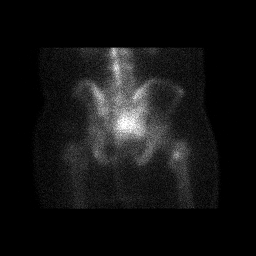
[im 3/5]
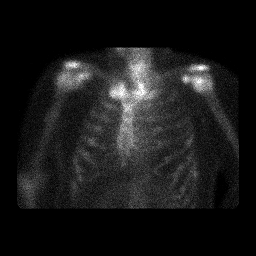
[im 3/5]
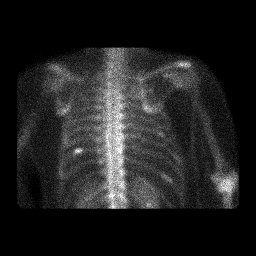
[im 4/5]
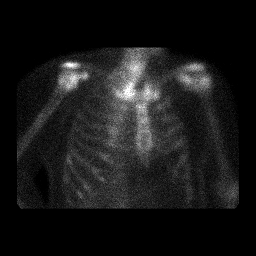
[im 4/5]
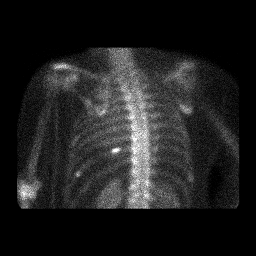
[im 5/5]
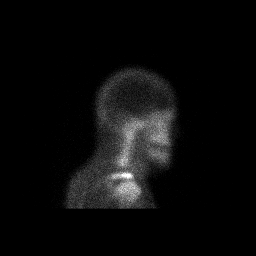
[im 5/5]
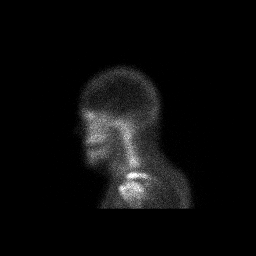

[12 of 12 positions shown; findings below may reference images not displayed]

FINDINGS: Bilateral renal function excretion. Punctate area of increased
activity noted adjacent to the left orbit. Head CT suggested for
further evaluation. Punctate areas of increased uptake in the left
posterior ninth and right posterior seventh, eighth, tenth and
twelfth ribs.Bilateral rib series suggest for further evaluation.
Punctate area of increased activity noted in the right proximal
femur, in the intertrochanteric region. Right hip series suggest for
further evaluation. Mild increased activity noted over the
shoulders, knees, and feet are most likely degenerative.
IMPRESSION: 1. Punctate area of increased activity noted adjacent to the left
orbit. Head CT suggested for further evaluation.

2. Punctate areas of increased activity in the left posterior ninth
rib and right posterior seventh, eighth, tenth, and twelfth ribs.
Bilateral rib series suggested for further evaluation.

3. Punctate area of increased activity in the right proximal femur,
in the intra trochanteric region. Right hip series suggest for
further evaluation.

## 2019-09-28 ENCOUNTER — Ambulatory Visit: Payer: 59 | Attending: Internal Medicine

## 2019-09-28 DIAGNOSIS — Z20822 Contact with and (suspected) exposure to covid-19: Secondary | ICD-10-CM

## 2019-09-30 LAB — NOVEL CORONAVIRUS, NAA: SARS-CoV-2, NAA: NOT DETECTED

## 2020-03-08 ENCOUNTER — Other Ambulatory Visit: Payer: Self-pay

## 2020-03-08 ENCOUNTER — Emergency Department: Payer: 59

## 2020-03-08 ENCOUNTER — Emergency Department
Admission: EM | Admit: 2020-03-08 | Discharge: 2020-03-08 | Disposition: A | Discharge status: Home / Self Care | Payer: 59 | Attending: Emergency Medicine | Admitting: Emergency Medicine

## 2020-03-08 DIAGNOSIS — S61215A Laceration without foreign body of left ring finger without damage to nail, initial encounter: Secondary | ICD-10-CM

## 2020-03-08 DIAGNOSIS — Y999 Unspecified external cause status: Secondary | ICD-10-CM | POA: Diagnosis not present

## 2020-03-08 DIAGNOSIS — Z23 Encounter for immunization: Secondary | ICD-10-CM | POA: Diagnosis not present

## 2020-03-08 DIAGNOSIS — Y92814 Boat as the place of occurrence of the external cause: Secondary | ICD-10-CM | POA: Diagnosis not present

## 2020-03-08 DIAGNOSIS — W2209XA Striking against other stationary object, initial encounter: Secondary | ICD-10-CM | POA: Insufficient documentation

## 2020-03-08 DIAGNOSIS — S62655B Nondisplaced fracture of medial phalanx of left ring finger, initial encounter for open fracture: Secondary | ICD-10-CM

## 2020-03-08 DIAGNOSIS — Y9389 Activity, other specified: Secondary | ICD-10-CM | POA: Diagnosis not present

## 2020-03-08 DIAGNOSIS — S62657B Nondisplaced fracture of medial phalanx of left little finger, initial encounter for open fracture: Secondary | ICD-10-CM | POA: Diagnosis not present

## 2020-03-08 MED ORDER — LEVOFLOXACIN 500 MG PO TABS: 500.0000 mg | ORAL_TABLET | Freq: Every day | ORAL | 0 refills | Status: AC

## 2020-03-08 MED ORDER — OXYCODONE-ACETAMINOPHEN 5-325 MG PO TABS
1.0000 | ORAL_TABLET | Freq: Once | ORAL | Status: AC
  Administered 2020-03-08: 1 via ORAL
  Filled 2020-03-08: qty 1

## 2020-03-08 MED ORDER — CEPHALEXIN 500 MG PO CAPS: 1000.0000 mg | ORAL_CAPSULE | Freq: Two times a day (BID) | ORAL | 0 refills | Status: AC

## 2020-03-08 MED ORDER — OXYCODONE-ACETAMINOPHEN 5-325 MG PO TABS: 1.0000 | ORAL_TABLET | Freq: Four times a day (QID) | ORAL | 0 refills | Status: DC | PRN

## 2020-03-08 MED ORDER — TETANUS-DIPHTH-ACELL PERTUSSIS 5-2.5-18.5 LF-MCG/0.5 IM SUSP
0.5000 mL | Freq: Once | INTRAMUSCULAR | Status: AC
  Administered 2020-03-08: 0.5 mL via INTRAMUSCULAR
  Filled 2020-03-08: qty 0.5
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
## 2020-04-21 ENCOUNTER — Other Ambulatory Visit: Payer: Self-pay

## 2020-04-21 DIAGNOSIS — N200 Calculus of kidney: Secondary | ICD-10-CM | POA: Diagnosis not present

## 2020-04-21 DIAGNOSIS — R109 Unspecified abdominal pain: Secondary | ICD-10-CM | POA: Diagnosis present

## 2020-04-21 LAB — COMPREHENSIVE METABOLIC PANEL
ALT: 30 U/L (ref 0–44)
AST: 25 U/L (ref 15–41)
Albumin: 4.3 g/dL (ref 3.5–5.0)
Alkaline Phosphatase: 114 U/L (ref 38–126)
Anion gap: 10 (ref 5–15)
BUN: 23 mg/dL — ABNORMAL HIGH (ref 6–20)
CO2: 26 mmol/L (ref 22–32)
Calcium: 9.5 mg/dL (ref 8.9–10.3)
Chloride: 101 mmol/L (ref 98–111)
Creatinine, Ser: 0.98 mg/dL (ref 0.61–1.24)
GFR calc Af Amer: >60 mL/min (ref >60)
GFR calc non Af Amer: >60 mL/min (ref >60)
Glucose, Bld: 120 mg/dL — ABNORMAL HIGH (ref 70–99)
Potassium: 3.9 mmol/L (ref 3.5–5.1)
Sodium: 137 mmol/L (ref 135–145)
Total Bilirubin: 0.5 mg/dL (ref 0.3–1.2)
Total Protein: 7.5 g/dL (ref 6.5–8.1)

## 2020-04-21 LAB — CBC
HCT: 38.3 % — ABNORMAL LOW (ref 39.0–52.0)
Hemoglobin: 12.2 g/dL — ABNORMAL LOW (ref 13.0–17.0)
MCH: 27.6 pg (ref 26.0–34.0)
MCHC: 31.9 g/dL (ref 30.0–36.0)
MCV: 86.7 fL (ref 80.0–100.0)
Platelets: 295 10*3/uL (ref 150–400)
RBC: 4.42 MIL/uL (ref 4.22–5.81)
RDW: 13.8 % (ref 11.5–15.5)
WBC: 12.3 10*3/uL — ABNORMAL HIGH (ref 4.0–10.5)
nRBC: 0 % (ref 0.0–0.2)

## 2020-04-21 LAB — URINALYSIS, COMPLETE (UACMP) WITH MICROSCOPIC
Bacteria, UA: NONE SEEN
Bilirubin Urine: NEGATIVE
Glucose, UA: NEGATIVE mg/dL
Hgb urine dipstick: NEGATIVE
Ketones, ur: NEGATIVE mg/dL
Leukocytes,Ua: NEGATIVE
Nitrite: NEGATIVE
Protein, ur: NEGATIVE mg/dL
Specific Gravity, Urine: 1.019 (ref 1.005–1.030)
pH: 5 (ref 5.0–8.0)

## 2020-04-21 NOTE — ED Notes (Signed)
Pt unable to void at this time. 

## 2020-04-21 NOTE — ED Triage Notes (Signed)
Pt has right flank pain.  Sx began 3 days ago.  No n/v/d   No hx kidney stones.  Denies urinary sx  Pt alert.

## 2020-04-22 ENCOUNTER — Emergency Department
Admission: EM | Admit: 2020-04-22 | Discharge: 2020-04-22 | Disposition: A | Discharge status: Home / Self Care | Payer: 59 | Attending: Emergency Medicine | Admitting: Emergency Medicine

## 2020-04-22 ENCOUNTER — Emergency Department: Payer: 59

## 2020-04-22 DIAGNOSIS — N200 Calculus of kidney: Secondary | ICD-10-CM

## 2020-04-22 DIAGNOSIS — R109 Unspecified abdominal pain: Secondary | ICD-10-CM

## 2020-04-22 MED ORDER — KETOROLAC TROMETHAMINE 30 MG/ML IJ SOLN
30.0000 mg | Freq: Once | INTRAMUSCULAR | Status: AC
  Administered 2020-04-22: 30 mg via INTRAMUSCULAR
  Filled 2020-04-22: qty 1

## 2020-04-22 MED ORDER — ONDANSETRON 4 MG PO TBDP: ORAL_TABLET | ORAL | 0 refills | Status: AC

## 2020-04-22 MED ORDER — OXYCODONE-ACETAMINOPHEN 5-325 MG PO TABS: 2.0000 | ORAL_TABLET | Freq: Three times a day (TID) | ORAL | 0 refills | Status: AC | PRN

## 2020-04-22 NOTE — ED Notes (Signed)
Signature Pad not operable

## 2020-04-22 NOTE — ED Provider Notes (Signed)
[facility] Emergency Department Provider Note  ____________________________________________   First MD Initiated Contact with Patient 04/22/20 0030     (approximate)  I have reviewed the triage vital signs and the nursing notes.   HISTORY  Chief Complaint Flank Pain    HPI Ralph Villegas is a 54 y.o. male with medical history as listed below who presents for evaluation of intermittent sharp and stabbing and occasionally dull aching pain in his right flank or right posterior lower back.   This has been going on for several days but it became severe tonight.  Nothing in particular made it worse.  He has no history of trauma and does not think he pulled any muscles.  He does have "a weird back" and sometimes he has pain and takes muscle relaxers but this feels different.  He has had no dysuria, hematuria, nor increased urinary frequency.  He denies fever/chills, sore throat, chest pain, shortness of breath, nausea, vomiting, nor any other abdominal pain.    He notes that he took an oxycodone that he had leftover from something else before he came to the emergency department and now he is feeling better.  Although he has no personal history of kidney stones, he said his dad had them all the time.        Past Medical History:  Diagnosis Date  . Acid reflux   . Aneurysm ([place])   . Bone cancer [facility])     Patient Active Problem List   Diagnosis Date Noted  . Acute pharyngitis 05/03/2019  . Sore throat 05/03/2019    Past Surgical History:  Procedure Laterality Date  . APPENDECTOMY      Prior to Admission medications   Medication Sig Start Date End Date Taking? Authorizing Provider  abiraterone acetate (ZYTIGA) 500 MG tablet Take 1,000 mg by mouth daily. Take on an empty stomach 1 hour before or 2 hours after a meal.    [provider]  amoxicillin-clavulanate (AUGMENTIN) 875-125 MG tablet Take 1 tablet by mouth 2 (two) times daily. 05/03/19    Ronnell Freshwater, NP  cephALEXin (KEFLEX) 500 MG capsule Take 2 capsules (1,000 mg total) by mouth 2 (two) times daily. 03/08/20   Cuthriell, Charline Bills, PA-C  Diphenhyd-Hydrocort-Nystatin (FIRST-DUKES MOUTHWASH) SUSP Swish and swallow with 36mls 3 to 4 times daily prn sore throat 05/03/19   Ronnell Freshwater, NP  ondansetron (ZOFRAN ODT) 4 MG disintegrating tablet Allow 1-2 tablets to dissolve in your mouth every 8 hours as needed for nausea/vomiting 04/22/20   Hinda Kehr, MD  oxyCODONE-acetaminophen (PERCOCET) 5-325 MG tablet Take 2 tablets by mouth every 8 (eight) hours as needed for severe pain. 04/22/20   Hinda Kehr, MD  predniSONE (DELTASONE) 5 MG tablet Take 5 mg by mouth daily with breakfast.    [provider]  venlafaxine (EFFEXOR) 37.5 MG tablet Take 37.5 mg by mouth daily.    [provider]    Allergies Patient has no known allergies.  Family History  Problem Relation Age of Onset  . Prostate cancer Maternal Uncle   . Kidney cancer Neg Hx   . Bladder Cancer Neg Hx     Social History Social History   Tobacco Use  . Smoking status: Never Smoker  . Smokeless tobacco: Never Used  Substance Use Topics  . Alcohol use: Yes    Comment: little  . Drug use: No    Review of Systems Constitutional: No fever/chills Eyes: No visual changes. ENT:  No sore throat. Cardiovascular: Denies chest pain. Respiratory: Denies shortness of breath. Gastrointestinal: No abdominal pain.  No nausea, no vomiting.  No diarrhea.  No constipation. Genitourinary: Negative for dysuria. Musculoskeletal: Right flank pain.  Negative for neck pain.   Integumentary: Negative for rash. Neurological: Negative for headaches, focal weakness or numbness.   ____________________________________________   PHYSICAL EXAM:  VITAL SIGNS: ED Triage Vitals  Enc Vitals Group     BP 04/21/20 2055 127/70     Pulse Rate 04/21/20 2055 90     Resp 04/21/20 2055 18     Temp 04/21/20 2055 99  F (37.2 C)     Temp Source 04/21/20 2055 Oral     SpO2 04/21/20 2055 97 %     Weight 04/21/20 2053 111.6 kg (246 lb)     Height 04/21/20 2053 1.778 m (5\' 10" )     Head Circumference --      Peak Flow --      Pain Score 04/21/20 2053 10     Pain Loc --      Pain Edu? --      Excl. in [place]? --     Constitutional: Alert and oriented.  Eyes: Conjunctivae are normal.  Head: Atraumatic. Nose: No congestion/rhinnorhea. Mouth/Throat: Patient is wearing a mask. Neck: No stridor.  No meningeal signs.   Cardiovascular: Normal rate, regular rhythm. Good peripheral circulation. Grossly normal heart sounds. Respiratory: Normal respiratory effort.  No retractions. Gastrointestinal: Soft and nontender. No distention.  Musculoskeletal: No CVA tenderness to percussion.  No lower extremity tenderness nor edema. No gross deformities of extremities. Neurologic:  Normal speech and language. No gross focal neurologic deficits are appreciated.  Skin:  Skin is warm, dry and intact. Psychiatric: Mood and affect are normal. Speech and behavior are normal.  ____________________________________________   LABS (all labs ordered are listed, but only abnormal results are displayed)  Labs Reviewed  COMPREHENSIVE METABOLIC PANEL - Abnormal; Notable for the following components:      Result Value   Glucose, Bld 120 (*)    BUN 23 (*)    All other components within normal limits  CBC - Abnormal; Notable for the following components:   WBC 12.3 (*)    Hemoglobin 12.2 (*)    HCT 38.3 (*)    All other components within normal limits  URINALYSIS, COMPLETE (UACMP) WITH MICROSCOPIC - Abnormal; Notable for the following components:   Color, Urine YELLOW (*)    APPearance CLEAR (*)    All other components within normal limits   ____________________________________________  EKG  No indication for emergent EKG ____________________________________________  RADIOLOGY I, Hinda Kehr, personally viewed and  evaluated these images (plain radiographs) as part of my medical decision making, as well as reviewing the written report by the radiologist.  ED MD interpretation:  No obstructive uropathy.  Small, 2-mm non-obstructive stone within right kidney.  No other acute abnormalities.  Official radiology report(s): CT Renal Stone Study  Result Date: 04/22/2020 CLINICAL DATA:  Right flank pain for 3 days. Suspected kidney stone. EXAM: CT ABDOMEN AND PELVIS WITHOUT CONTRAST TECHNIQUE: Multidetector CT imaging of the abdomen and pelvis was performed following the standard protocol without IV contrast. COMPARISON:  CT pelvis 03/18/2017 FINDINGS: Lower chest: Lung bases are clear. Hepatobiliary: No focal liver abnormality is seen. No gallstones, gallbladder wall thickening, or biliary dilatation. Pancreas: Unremarkable. No pancreatic ductal dilatation or surrounding inflammatory changes. Spleen: Normal in size without focal abnormality. Adrenals/Urinary Tract: No adrenal gland nodules. 2 mm  stone in the midpole right kidney. No hydronephrosis or hydroureter. No ureteral stones or bladder stones. No bladder wall thickening. Left kidney and ureter are unremarkable. Stomach/Bowel: Stomach and small bowel are decompressed. Scattered stool in the colon. Scattered colonic diverticula. No inflammatory changes or obstruction. Appendix is surgically absent. Vascular/Lymphatic: No significant vascular findings are present. No enlarged abdominal or pelvic lymph nodes. Reproductive: Prostate gland is surgically absent. Surgical clips in the pelvis. No pelvic mass or lymphadenopathy. Other: No free air or free fluid in the abdomen. Musculoskeletal: Focal sclerosis in the inter trochanteric region of the right proximal femur without change since previous study. Probably bone infarct or enchondroma. No other focal bone lesions identified. IMPRESSION: 2 mm nonobstructing stone in the right kidney. No ureteral stone or obstruction.  Electronically Signed   By: Lucienne Capers M.D.   On: 04/22/2020 01:47    ____________________________________________   PROCEDURES   Procedure(s) performed (including Critical Care):  Procedures   ____________________________________________   INITIAL IMPRESSION / MDM / [place] / ED COURSE  As part of my medical decision making, I reviewed the following data within the [facility] notes reviewed and incorporated, Labs reviewed , Old chart reviewed, Notes from prior ED visits and [place] Controlled Substance Database   Differential diagnosis includes, but is not limited to, renal/ureteral colic, UTI/pyelonephritis, musculoskeletal strain, much less likely renal infarction.  Patient is well-appearing and in no distress at this time.  Vital signs are stable with no tachycardia and he is normotensive and afebrile.  His comprehensive metabolic panel is normal.  He has a mild leukocytosis of 12.3 which is of unclear significance.  His urinalysis is quite normal with no evidence of infection or hematuria.  However his symptoms are most consistent with some mild renal colic.  We discussed it and since he has no prior personal history of kidney stones but a strong family history, we will obtain a CT renal stone protocol to evaluate for the possibility of a persistent stone which is causing obstruction and that is why he is not showing any hematuria.  He still has some pain even though he has no tenderness at this time but the pain is improved dramatically.  I will reassess once the CT scan is back.       Clinical Course as of Apr 23 235  Tue Apr 22, 2020  0232 CT is unremarkable except for a 2 mm nonobstructing stone in the right kidney. This supports my hypothesis that he had a small stone that he has already passed that caused him pain and is still causing some residual discomfort. I updated the patient about the results. He is comfortable with the plan  for discharge and outpatient follow-up. I am giving him an intramuscular injection of Toradol 30 mg prior to departure. I gave my usual and customary follow-up recommendations and return precautions.  CT Renal Laren Everts [CF]    Clinical Course User Index [CF] Hinda Kehr, MD     ____________________________________________  FINAL CLINICAL IMPRESSION(S) / ED DIAGNOSES  Final diagnoses:  Right flank pain  Kidney stone     MEDICATIONS GIVEN DURING THIS VISIT:  Medications  ketorolac (TORADOL) 30 MG/ML injection 30 mg (has no administration in time range)     ED Discharge Orders         Ordered    oxyCODONE-acetaminophen (PERCOCET) 5-325 MG tablet  Every 8 hours PRN     Discontinue  Reprint  04/22/20 0234    ondansetron (ZOFRAN ODT) 4 MG disintegrating tablet     Discontinue  Reprint     04/22/20 0234          *Please note:  Ralph Villegas was evaluated in Emergency Department on 04/22/2020 for the symptoms described in the history of present illness. He was evaluated in the context of the global COVID-19 pandemic, which necessitated consideration that the patient might be at risk for infection with the SARS-CoV-2 virus that causes COVID-19. Institutional protocols and algorithms that pertain to the evaluation of patients at risk for COVID-19 are in a state of rapid change based on information released by regulatory bodies including the CDC and federal and state organizations. These policies and algorithms were followed during the patient's care in the ED.  Some ED evaluations and interventions may be delayed as a result of limited staffing during and after the pandemic.*  Note:  This document was prepared using Dragon voice recognition software and may include unintentional dictation errors.   Hinda Kehr, MD 04/22/20 [phone]

## 2020-04-22 NOTE — Discharge Instructions (Signed)
You have been seen in the Emergency Department (ED) today for pain we believe to be caused by kidney stones.  As we have discussed, please drink plenty of fluids.  Please make a follow up appointment with the physician(s) listed elsewhere in this documentation.  You may take pain medication as needed but ONLY as prescribed.  We also recommend that you take over-the-counter ibuprofen regularly according to label instructions over the next 5 days.  Take it with meals to minimize stomach discomfort.  As we discussed, we believe you already passed 1 stone that was likely the main cause of your pain. You have a small, 2 mm stone still within the right kidney. It is unlikely to be causing you pain right now but it may do so if it decides to pass out of the kidney and into the ureter.  Do not drink alcohol, drive or participate in any other potentially dangerous activities while taking opiate pain medication as it may make you sleepy. Do not take this medication with any other sedating medications, either prescription or over-the-counter. If you were prescribed Percocet or Vicodin, do not take these with acetaminophen (Tylenol) as it is already contained within these medications.   Take Percocet as needed for severe pain.  This medication is an opiate (or narcotic) pain medication and can be habit forming.  Use it as little as possible to achieve adequate pain control.  Do not use or use it with extreme caution if you have a history of opiate abuse or dependence.  If you are on a pain contract with your primary care doctor or a pain specialist, be sure to let them know you were prescribed this medication today from the [facility] Emergency Department.  This medication is intended for your use only - do not give any to anyone else and keep it in a secure place where nobody else, especially children, have access to it.  It will also cause or worsen constipation, so you may want to consider taking an  over-the-counter stool softener while you are taking this medication.  Return to the Emergency Department (ED) or call your doctor if you have any worsening pain, fever, painful urination, are unable to urinate, or develop other symptoms that concern you.

## 2020-04-24 ENCOUNTER — Ambulatory Visit: Admission: RE | Admit: 2020-04-24 | Payer: 59 | Source: Ambulatory Visit | Admitting: Urology

## 2020-04-24 ENCOUNTER — Other Ambulatory Visit: Payer: Self-pay

## 2020-04-24 ENCOUNTER — Ambulatory Visit (INDEPENDENT_AMBULATORY_CARE_PROVIDER_SITE_OTHER): Payer: 59 | Admitting: Urology

## 2020-04-24 ENCOUNTER — Ambulatory Visit
Admission: RE | Admit: 2020-04-24 | Discharge: 2020-04-24 | Disposition: A | Discharge status: Home / Self Care | Payer: 59 | Source: Ambulatory Visit | Attending: Urology | Admitting: Urology

## 2020-04-24 VITALS — BP 118/72 | HR 85 | Ht 70.0 in | Wt 253.0 lb

## 2020-04-24 DIAGNOSIS — N200 Calculus of kidney: Secondary | ICD-10-CM | POA: Diagnosis not present

## 2020-04-24 DIAGNOSIS — C61 Malignant neoplasm of prostate: Secondary | ICD-10-CM | POA: Diagnosis not present

## 2020-04-24 DIAGNOSIS — R109 Unspecified abdominal pain: Secondary | ICD-10-CM

## 2020-04-25 ENCOUNTER — Encounter: Payer: Self-pay | Admitting: Urology

## 2020-04-25 ENCOUNTER — Telehealth: Payer: Self-pay

## 2020-04-25 DIAGNOSIS — N200 Calculus of kidney: Secondary | ICD-10-CM | POA: Insufficient documentation

## 2020-04-25 DIAGNOSIS — C61 Malignant neoplasm of prostate: Secondary | ICD-10-CM | POA: Insufficient documentation

## 2020-04-25 DIAGNOSIS — R109 Unspecified abdominal pain: Secondary | ICD-10-CM

## 2020-04-25 NOTE — Progress Notes (Signed)
04/24/2020 9:53 PM   Ralph Villegas [birth year] [id number]  Referring provider: Lavera Villegas, Ralph Villegas,  [place] [address]  Chief Complaint  Patient presents with  . Nephrolithiasis    HPI: Ralph Villegas is a 54 y.o. male who presents for ED follow-up of back pain and nephrolithiasis.   Onset of right back pain several days ago.  Pain located right lower back along the posterior axillary line and radiates across the right lower ribs  Described as intermittently sharp and stabbing without identifiable precipitating, aggravating or alleviating factors  Seen in the ED 04/22/2020 for acute worsening of his pain.  Positive nausea and vomiting  No fever or chills   no bothersome LUTS  Denies dysuria, gross hematuria  Stone protocol CT abdomen/pelvis with a 2 mm midpole right renal calculus, no hydronephrosis; no ureteral calculi  Yesterday he had onset of diarrhea  Past urologic history markable for prostate cancer  Diagnosed [facility] 2018 and underwent radical retropubic prostatectomy by Dr. Darcus Austin at [facility] 06/29/2017 with pathologic [id number] cancer Gleason 4+5  Elevated PSA postop and bone scan positive for metastatic disease  Followed by medical oncology at [facility] on leuprolide/Zytiga/prednisone  Zytiga/prednisone stopped April 2021  Leuprolide held 03/31/2020  PMH: Past Medical History:  Diagnosis Date  . Acid reflux   . Aneurysm ([place])   . Bone cancer [facility])     Surgical History: Past Surgical History:  Procedure Laterality Date  . APPENDECTOMY      Home Medications:  Allergies as of 04/24/2020   No Known Allergies     Medication List       Accurate as of April 24, 2020 11:59 PM. If you have any questions, ask your nurse or doctor.        STOP taking these medications   abiraterone acetate 500 MG tablet Commonly known as: ZYTIGA Stopped by: Abbie Sons, MD   amoxicillin-clavulanate 875-125 MG tablet Commonly known as:  AUGMENTIN Stopped by: Abbie Sons, MD   First-Dukes Mouthwash Susp Stopped by: Abbie Sons, MD   predniSONE 5 MG tablet Commonly known as: DELTASONE Stopped by: Abbie Sons, MD     TAKE these medications   cephALEXin 500 MG capsule Commonly known as: KEFLEX Take 2 capsules (1,000 mg total) by mouth 2 (two) times daily.   ondansetron 4 MG disintegrating tablet Commonly known as: Zofran ODT Allow 1-2 tablets to dissolve in your mouth every 8 hours as needed for nausea/vomiting   oxyCODONE-acetaminophen 5-325 MG tablet Commonly known as: Percocet Take 2 tablets by mouth every 8 (eight) hours as needed for severe pain.   venlafaxine 37.5 MG tablet Commonly known as: EFFEXOR Take 37.5 mg by mouth daily.       Allergies: No Known Allergies  Family History: Family History  Problem Relation Age of Onset  . Prostate cancer Maternal Uncle   . Kidney cancer Neg Hx   . Bladder Cancer Neg Hx     Social History:  reports that he has never smoked. He has never used smokeless tobacco. He reports current alcohol use. He reports that he does not use drugs.   Physical Exam: BP 118/72   Pulse 85   Ht 5\' 10"  (1.778 m)   Wt 253 lb (114.8 kg)   BMI 36.30 kg/m   Constitutional:  Alert and oriented, No acute distress. HEENT: [place] AT, moist mucus membranes.  Trachea midline, no masses. Cardiovascular: No clubbing, cyanosis, or edema. Respiratory: Normal respiratory effort,  no increased work of breathing. GU: No CVA tenderness Skin: No rashes, bruises or suspicious lesions. Neurologic: Grossly intact, no focal deficits, moving all 4 extremities. Psychiatric: Normal mood and affect.   Pertinent Imaging: Images were personally reviewed  CT Renal Stone Study  Narrative CLINICAL DATA:  Right flank pain for 3 days. Suspected kidney stone.  EXAM: CT ABDOMEN AND PELVIS WITHOUT CONTRAST  TECHNIQUE: Multidetector CT imaging of the abdomen and pelvis was  performed following the standard protocol without IV contrast.  COMPARISON:  CT pelvis 03/18/2017  FINDINGS: Lower chest: Lung bases are clear.  Hepatobiliary: No focal liver abnormality is seen. No gallstones, gallbladder wall thickening, or biliary dilatation.  Pancreas: Unremarkable. No pancreatic ductal dilatation or surrounding inflammatory changes.  Spleen: Normal in size without focal abnormality.  Adrenals/Urinary Tract: No adrenal gland nodules. 2 mm stone in the midpole right kidney. No hydronephrosis or hydroureter. No ureteral stones or bladder stones. No bladder wall thickening. Left kidney and ureter are unremarkable.  Stomach/Bowel: Stomach and small bowel are decompressed. Scattered stool in the colon. Scattered colonic diverticula. No inflammatory changes or obstruction. Appendix is surgically absent.  Vascular/Lymphatic: No significant vascular findings are present. No enlarged abdominal or pelvic lymph nodes.  Reproductive: Prostate gland is surgically absent. Surgical clips in the pelvis. No pelvic mass or lymphadenopathy.  Other: No free air or free fluid in the abdomen.  Musculoskeletal: Focal sclerosis in the inter trochanteric region of the right proximal femur without change since previous study. Probably bone infarct or enchondroma. No other focal bone lesions identified.  IMPRESSION: 2 mm nonobstructing stone in the right kidney. No ureteral stone or obstruction.   Electronically Signed By: Lucienne Capers M.D. On: 04/22/2020 01:47   Assessment & Plan:    1.  Nephrolithiasis  2 mm nonobstructing right renal calculus which would not be a source of his pain.  We discussed small nonobstructing stones are typically not an etiology of flank pain.  Urinalysis was normal and pain atypical for renal colic  Consider musculoskeletal or GI etiology  Recent PSA was undetectable  We will obtain a KUB today to make sure his 2 mm calculus has not  migrated to the ureter.  Recommend PCP follow-up to evaluate for other pain etiologies if no evidence of a ureteral calculus  He will be contacted with KUB results    Abbie Sons, [place] [address] [place], [place] [address] [phone]

## 2020-04-25 NOTE — Telephone Encounter (Signed)
Called pt informed him of the information below. Pt gave verbal understanding. Pt requests we place referral. Referral placed.

## 2020-04-25 NOTE — Telephone Encounter (Signed)
Pt calling for KUB results, please advise.

## 2020-04-25 NOTE — Telephone Encounter (Signed)
Ralph Villegas is visualized over the midportion of the right kidney and is in a position that would not be causing pain.  I do not think his symptoms are urologic in etiology and would recommend he see his PCP to be evaluated for a GI source of his pain

## 2020-04-29 ENCOUNTER — Emergency Department: Payer: 59

## 2020-04-29 ENCOUNTER — Other Ambulatory Visit: Payer: Self-pay

## 2020-04-29 ENCOUNTER — Encounter: Payer: Self-pay | Admitting: Intensive Care

## 2020-04-29 ENCOUNTER — Emergency Department
Admission: EM | Admit: 2020-04-29 | Discharge: 2020-04-29 | Disposition: A | Discharge status: Home / Self Care | Payer: 59 | Attending: Emergency Medicine | Admitting: Emergency Medicine

## 2020-04-29 DIAGNOSIS — R1011 Right upper quadrant pain: Secondary | ICD-10-CM | POA: Insufficient documentation

## 2020-04-29 DIAGNOSIS — Z20822 Contact with and (suspected) exposure to covid-19: Secondary | ICD-10-CM | POA: Diagnosis not present

## 2020-04-29 DIAGNOSIS — M25511 Pain in right shoulder: Secondary | ICD-10-CM | POA: Diagnosis not present

## 2020-04-29 DIAGNOSIS — R110 Nausea: Secondary | ICD-10-CM | POA: Diagnosis not present

## 2020-04-29 HISTORY — DX: Malignant neoplasm of prostate: C61

## 2020-04-29 LAB — CBC
HCT: 35.8 % — ABNORMAL LOW (ref 39.0–52.0)
Hemoglobin: 11.7 g/dL — ABNORMAL LOW (ref 13.0–17.0)
MCH: 28.1 pg (ref 26.0–34.0)
MCHC: 32.7 g/dL (ref 30.0–36.0)
MCV: 85.9 fL (ref 80.0–100.0)
Platelets: 279 10*3/uL (ref 150–400)
RBC: 4.17 MIL/uL — ABNORMAL LOW (ref 4.22–5.81)
RDW: 13.5 % (ref 11.5–15.5)
WBC: 11.6 10*3/uL — ABNORMAL HIGH (ref 4.0–10.5)
nRBC: 0 % (ref 0.0–0.2)

## 2020-04-29 LAB — URINALYSIS, COMPLETE (UACMP) WITH MICROSCOPIC
Bacteria, UA: NONE SEEN
Bilirubin Urine: NEGATIVE
Glucose, UA: NEGATIVE mg/dL
Hgb urine dipstick: NEGATIVE
Ketones, ur: NEGATIVE mg/dL
Leukocytes,Ua: NEGATIVE
Nitrite: NEGATIVE
Protein, ur: NEGATIVE mg/dL
Specific Gravity, Urine: 1.015 (ref 1.005–1.030)
pH: 5 (ref 5.0–8.0)

## 2020-04-29 LAB — COMPREHENSIVE METABOLIC PANEL
ALT: 81 U/L — ABNORMAL HIGH (ref 0–44)
AST: 40 U/L (ref 15–41)
Albumin: 4.1 g/dL (ref 3.5–5.0)
Alkaline Phosphatase: 122 U/L (ref 38–126)
Anion gap: 10 (ref 5–15)
BUN: 16 mg/dL (ref 6–20)
CO2: 26 mmol/L (ref 22–32)
Calcium: 8.9 mg/dL (ref 8.9–10.3)
Chloride: 101 mmol/L (ref 98–111)
Creatinine, Ser: 0.82 mg/dL (ref 0.61–1.24)
GFR calc Af Amer: >60 mL/min (ref >60)
GFR calc non Af Amer: >60 mL/min (ref >60)
Glucose, Bld: 116 mg/dL — ABNORMAL HIGH (ref 70–99)
Potassium: 4.2 mmol/L (ref 3.5–5.1)
Sodium: 137 mmol/L (ref 135–145)
Total Bilirubin: 0.5 mg/dL (ref 0.3–1.2)
Total Protein: 7.1 g/dL (ref 6.5–8.1)

## 2020-04-29 LAB — LIPASE, BLOOD: Lipase: 34 U/L (ref 11–51)

## 2020-04-29 NOTE — ED Provider Notes (Signed)
[facility] Emergency Department Provider Note  ____________________________________________   First MD Initiated Contact with Patient 04/29/20 2018     (approximate)  I have reviewed the triage vital signs and the nursing notes.   HISTORY  Chief Complaint Abdominal Pain    HPI Ralph Villegas is a 54 y.o. male presents to the emergency department complaining of nausea, right shoulder pain, epigastric pain.  Patient was diagnosed with a kidney stone in the right kidney but the urologist told him this is not in the position that should cause the amount of pain he is having he thinks he has problems with his gallbladder.  Patient was seen here and had a CT the other night.  CT did not show any gallstones.  He denies any fever or chills.  No chest pain or shortness of breath.    Past Medical History:  Diagnosis Date  . Acid reflux   . Aneurysm ([place])   . Bone cancer ([place])   . Prostate cancer [facility])     Patient Active Problem List   Diagnosis Date Noted  . Nephrolithiasis 04/25/2020  . Prostate cancer ([place]) 04/25/2020  . Acute pharyngitis 05/03/2019  . Sore throat 05/03/2019    Past Surgical History:  Procedure Laterality Date  . APPENDECTOMY      Prior to Admission medications   Medication Sig Start Date End Date Taking? Authorizing Provider  cephALEXin (KEFLEX) 500 MG capsule Take 2 capsules (1,000 mg total) by mouth 2 (two) times daily. 03/08/20   Cuthriell, Charline Bills, PA-C  ondansetron (ZOFRAN ODT) 4 MG disintegrating tablet Allow 1-2 tablets to dissolve in your mouth every 8 hours as needed for nausea/vomiting 04/22/20   Hinda Kehr, MD  oxyCODONE-acetaminophen (PERCOCET) 5-325 MG tablet Take 2 tablets by mouth every 8 (eight) hours as needed for severe pain. 04/22/20   Hinda Kehr, MD  venlafaxine (EFFEXOR) 37.5 MG tablet Take 37.5 mg by mouth daily.    [provider]    Allergies Patient has no known allergies.  Family History    Problem Relation Age of Onset  . Prostate cancer Maternal Uncle   . Kidney cancer Neg Hx   . Bladder Cancer Neg Hx     Social History Social History   Tobacco Use  . Smoking status: Never Smoker  . Smokeless tobacco: Never Used  Substance Use Topics  . Alcohol use: Yes    Alcohol/week: 3.0 standard drinks    Types: 3 Shots of liquor per week  . Drug use: No    Review of Systems  Constitutional: No fever/chills Eyes: No visual changes. ENT: No sore throat. Respiratory: Denies cough Cardiovascular: Denies chest pain Gastrointestinal: Positive abdominal pain Genitourinary: Negative for dysuria. Musculoskeletal: Negative for back pain. Skin: Negative for rash. Psychiatric: no mood changes,     ____________________________________________   PHYSICAL EXAM:  VITAL SIGNS: ED Triage Vitals  Enc Vitals Group     BP 04/29/20 1619 133/60     Pulse Rate 04/29/20 1619 89     Resp 04/29/20 1619 16     Temp 04/29/20 1619 98.4 F (36.9 C)     Temp Source 04/29/20 1619 Oral     SpO2 04/29/20 1619 99 %     Weight 04/29/20 1620 245 lb (111.1 kg)     Height 04/29/20 1620 5\' 10"  (1.778 m)     Head Circumference --      Peak Flow --      Pain Score 04/29/20 1620  3     Pain Loc --      Pain Edu? --      Excl. in [place]? --     Constitutional: Alert and oriented. Well appearing and in no acute distress. Eyes: Conjunctivae are normal.  Head: Atraumatic. Nose: No congestion/rhinnorhea. Mouth/Throat: Mucous membranes are moist.   Neck:  supple no lymphadenopathy noted Cardiovascular: Normal rate, regular rhythm. Heart sounds are normal Respiratory: Normal respiratory effort.  No retractions, lungs c t a  Abd: soft tender in the ruq, bs normal all 4 quad GU: deferred Musculoskeletal: FROM all extremities, warm and well perfused Neurologic:  Normal speech and language.  Skin:  Skin is warm, dry and intact. No rash noted. Psychiatric: Mood and affect are normal. Speech and  behavior are normal.  ____________________________________________   LABS (all labs ordered are listed, but only abnormal results are displayed)  Labs Reviewed  COMPREHENSIVE METABOLIC PANEL - Abnormal; Notable for the following components:      Result Value   Glucose, Bld 116 (*)    ALT 81 (*)    All other components within normal limits  CBC - Abnormal; Notable for the following components:   WBC 11.6 (*)    RBC 4.17 (*)    Hemoglobin 11.7 (*)    HCT 35.8 (*)    All other components within normal limits  URINALYSIS, COMPLETE (UACMP) WITH MICROSCOPIC - Abnormal; Notable for the following components:   Color, Urine YELLOW (*)    APPearance CLEAR (*)    All other components within normal limits  SARS CORONAVIRUS 2 BY RT PCR (HOSPITAL ORDER, [place] [facility])  LIPASE, BLOOD   ____________________________________________   ____________________________________________  RADIOLOGY  Ultrasound right upper quadrant  ____________________________________________   PROCEDURES  Procedure(s) performed: No  Procedures    ____________________________________________   INITIAL IMPRESSION / ASSESSMENT AND PLAN / ED COURSE  Pertinent labs & imaging results that were available during my care of the patient were reviewed by me and considered in my medical decision making (see chart for details).   Patient is a 54 year old male presents emergency department with right hip pain.  See HPI.  Physical exam shows right upper quadrant tender to palpation.  DDx: Acute cholecystitis, kidney stone, chest wall pain  CBC has elevated WBC of 11.6, comprehensive metabolic panel has a increased ALT of 81, lipase normal, urinalysis normal  Ultrasound right upper quadrant Chest x-ray  Ultrasound chest x-ray read as negative.  I did explain the findings to the patient.  Due to the vomiting/diarrhea along with some aching in the right shoulder I do feel would be  prudent to test the patient for Covid.  I informed him we will notify him of his test results tomorrow.  He was discharged in stable condition with strict instructions to return if worsening.  Follow-up with his regular doctor if not improving in 3 to 4 days.     Ralph Villegas was evaluated in Emergency Department on 04/29/2020 for the symptoms described in the history of present illness. He was evaluated in the context of the global COVID-19 pandemic, which necessitated consideration that the patient might be at risk for infection with the SARS-CoV-2 virus that causes COVID-19. Institutional protocols and algorithms that pertain to the evaluation of patients at risk for COVID-19 are in a state of rapid change based on information released by regulatory bodies including the CDC and federal and state organizations. These policies and algorithms were followed during the  patient's care in the ED.    As part of my medical decision making, I reviewed the following data within the [facility] notes reviewed and incorporated, Labs reviewed , Old chart reviewed, Radiograph reviewed , Notes from prior ED visits and [place] Controlled Substance Database  ____________________________________________   FINAL CLINICAL IMPRESSION(S) / ED DIAGNOSES  Final diagnoses:  RUQ pain  Suspected COVID-19 virus infection      NEW MEDICATIONS STARTED DURING THIS VISIT:  New Prescriptions   No medications on file     Note:  This document was prepared using Dragon voice recognition software and may include unintentional dictation errors.    Versie Starks, PA-C 04/29/20 2322    Vladimir Crofts, MD 04/29/20 [phone]

## 2020-04-29 NOTE — ED Triage Notes (Signed)
Patient c/o right flank pain X1 week. Reports being diagnosed with kidney stone and saw urologist who told him the kidney ston eis in his bladder and shouldn't be causing his pain. Urologist believes this is a gallbladder issue. Patient reports burning sensation around mid abdomen. Tenderness noted to right side. Reports pain/tenderness is worse after eating.

## 2020-04-29 NOTE — Discharge Instructions (Signed)
Follow-up with your regular doctor if not improving in 3 to 4 days.  Return emergency department worsening.  If your right upper quadrant pain continues you may want to see a GI specialist for evaluation.  They may need to order a test called a HIDA scan If your Covid test is positive you will need to quarantine for 14 days from onset of symptoms

## 2020-04-29 NOTE — ED Notes (Signed)
Pt states being diagnosed with kidney stones last week. Pt states he has had nausea and diarrhea. Pt states feeling better. Pt waiting on ultrasound of abdomen.

## 2020-04-30 LAB — SARS CORONAVIRUS 2 BY RT PCR (HOSPITAL ORDER, PERFORMED IN ~~LOC~~ HOSPITAL LAB): SARS Coronavirus 2: NEGATIVE

## 2020-05-20 ENCOUNTER — Ambulatory Visit (INDEPENDENT_AMBULATORY_CARE_PROVIDER_SITE_OTHER): Payer: 59 | Admitting: Dermatology

## 2020-05-20 ENCOUNTER — Encounter: Payer: Self-pay | Admitting: Dermatology

## 2020-05-20 ENCOUNTER — Other Ambulatory Visit: Payer: Self-pay

## 2020-05-20 DIAGNOSIS — D2361 Other benign neoplasm of skin of right upper limb, including shoulder: Secondary | ICD-10-CM

## 2020-05-20 DIAGNOSIS — Z1283 Encounter for screening for malignant neoplasm of skin: Secondary | ICD-10-CM

## 2020-05-20 DIAGNOSIS — D485 Neoplasm of uncertain behavior of skin: Secondary | ICD-10-CM

## 2020-05-20 DIAGNOSIS — D489 Neoplasm of uncertain behavior, unspecified: Secondary | ICD-10-CM

## 2020-05-20 DIAGNOSIS — L918 Other hypertrophic disorders of the skin: Secondary | ICD-10-CM

## 2020-05-20 DIAGNOSIS — D225 Melanocytic nevi of trunk: Secondary | ICD-10-CM

## 2020-05-20 DIAGNOSIS — D2362 Other benign neoplasm of skin of left upper limb, including shoulder: Secondary | ICD-10-CM

## 2020-05-20 DIAGNOSIS — Z86018 Personal history of other benign neoplasm: Secondary | ICD-10-CM

## 2020-05-20 DIAGNOSIS — D1800 Hemangioma unspecified site: Secondary | ICD-10-CM

## 2020-05-20 DIAGNOSIS — D229 Melanocytic nevi, unspecified: Secondary | ICD-10-CM

## 2020-05-20 DIAGNOSIS — L720 Epidermal cyst: Secondary | ICD-10-CM | POA: Diagnosis not present

## 2020-05-20 DIAGNOSIS — L821 Other seborrheic keratosis: Secondary | ICD-10-CM

## 2020-05-20 DIAGNOSIS — L820 Inflamed seborrheic keratosis: Secondary | ICD-10-CM | POA: Diagnosis not present

## 2020-05-20 DIAGNOSIS — L578 Other skin changes due to chronic exposure to nonionizing radiation: Secondary | ICD-10-CM

## 2020-05-20 DIAGNOSIS — C4491 Basal cell carcinoma of skin, unspecified: Secondary | ICD-10-CM

## 2020-05-20 DIAGNOSIS — D239 Other benign neoplasm of skin, unspecified: Secondary | ICD-10-CM

## 2020-05-20 DIAGNOSIS — L814 Other melanin hyperpigmentation: Secondary | ICD-10-CM

## 2020-05-20 HISTORY — DX: Basal cell carcinoma of skin, unspecified: C44.91

## 2020-05-20 NOTE — Progress Notes (Signed)
Follow-Up Visit   Subjective  Ralph Villegas is a 54 y.o. male who presents for the following: area of concern.  Patients presents today for an UBSE, has area of concern on his nose that is crusted, never heals, and bleeds on occasion for past 3 to 5 years. Also has a newer brown spot on other side of nose.  Patient has h/o of slight to moderate atypical nevi. 07/30/18 L UQA Med Sup Moderate atypia, 06/18/08 R UQA and L costal margin Slight to moderate atypia.  He also has some spots on sides that itch constantly.  The following portions of the chart were reviewed this encounter and updated as appropriate:      Review of Systems:  No other skin or systemic complaints except as noted in HPI or Assessment and Plan.  Objective  Well appearing patient in no apparent distress; mood and affect are within normal limits.  A focused examination was performed including Nose. Relevant physical exam findings are noted in the Assessment and Plan.  Objective  Left Upper Nasal Dorsum: 8 mm x 6 mm pink brown scaly macule     Objective  Left Abdomen (side) - Lower, Right Flank x 4 (4), Right Upper Nasal Dorsum: Erythematous keratotic or waxy stuck-on papule or plaque. 4 mm on R nasal dorsum  Objective  Left Spinal Mid Back: Subcutaneous nodule.   Objective  Left Shoulder - Anterior, Right Forearm - Anterior: Firm pink/brown papulenodule with dimple sign.   Objective  Right Flank, Right lateral abd: 6 x 4 mm speckled med dark brown papule Right Flank  5 x 4 mm two tone brown papule darker edge Right lateral abd  Objective  Abdomen: Scar with no evidence of recurrence.    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 Anesthesia: the lesion was  anesthetized in a standard fashion   Anesthetic:  1% lidocaine w/ epinephrine 1-100,000 buffered w/ 8.4% NaHCO3 Instrument used: flexible razor blade   Hemostasis achieved with: pressure, aluminum chloride and electrodesiccation   Outcome: patient tolerated procedure well   Post-procedure details: wound care instructions given   Post-procedure details comment:  Ointment and small bandage applied  Shave biopsy today  Other Related Procedures Anatomic Pathology Report  Inflamed seborrheic keratosis (6) Left Abdomen (side) - Lower; Right Flank x 4 (4); Right Upper Nasal Dorsum  Cryotherapy today Prior to procedure, discussed risks of blister formation, small wound, skin dyspigmentation, or rare scar following cryotherapy.    Destruction of lesion - Left Abdomen (side) - Lower, Right Flank x 4, Right Upper Nasal Dorsum  Destruction method: cryotherapy   Informed consent: discussed and consent obtained   Lesion destroyed using liquid nitrogen: Yes   Region frozen until ice ball extended beyond lesion: Yes   Outcome: patient tolerated procedure well with no complications   Post-procedure details: wound care instructions given    Epidermal cyst Left Spinal Mid Back  Reassured benign growth.  Recommend observation.  Discussed surgical excision in office if changes noted or symptomatic.   Dermatofibroma (2) Left Shoulder - Anterior; Right Forearm - Anterior  Benign, observe.     Nevus (2) Right Flank; Right lateral abd  Benign-appearing.  Observation.  Call clinic for new or changing moles.  Recommend daily use  of broad spectrum spf 30+ sunscreen to sun-exposed areas.     History of dysplastic nevus Abdomen  Clear. Observe for recurrence. Call clinic for new or changing moles.  Recommend regular skin exams, daily broad-spectrum spf 30+ sunscreen use, and photoprotection.     Lentigines - Scattered tan macules - Discussed due to sun exposure - Benign, observe - Call for  any changes  Seborrheic Keratoses - Stuck-on, waxy, tan-brown papules and plaques  - Discussed benign etiology and prognosis. - Observe - Call for any changes  Melanocytic Nevi - Tan-brown and/or pink-flesh-colored symmetric macules and papules - Benign appearing on exam today - Observation - Call clinic for new or changing moles - Recommend daily use of broad spectrum spf 30+ sunscreen to sun-exposed areas.   Hemangiomas - Red papules - Discussed benign nature - Observe - Call for any changes  Actinic Damage - diffuse scaly erythematous macules with underlying dyspigmentation - Recommend daily broad spectrum sunscreen SPF 30+ to sun-exposed areas, reapply every 2 hours as needed.  - Call for new or changing lesions.  Acrochordons (Skin Tags) - Fleshy, skin-colored pedunculated papules - Benign appearing.  - Observe. - If desired, they can be removed with an in office procedure that is not covered by insurance. - Please call the clinic if you notice any new or changing lesions.   Skin cancer screening performed today.   Return in about 6 months (around 11/17/2020) for or sooner pending bx.  Marene Lenz, CMA, am acting as scribe for Brendolyn Patty, MD .  Documentation: I have reviewed the above documentation for accuracy and completeness, and I agree with the above.  Brendolyn Patty MD

## 2020-05-20 NOTE — Patient Instructions (Addendum)
Recommend daily broad spectrum sunscreen SPF 30+ to sun-exposed areas, reapply every 2 hours as needed. Call for new or changing lesions.  Prior to procedure, discussed risks of blister formation, small wound, skin dyspigmentation, or rare scar following cryotherapy.  Liquid nitrogen was applied for 10-12 seconds to the skin lesion and the expected blistering or scabbing reaction explained. Do not pick at the area. Patient reminded to expect hypopigmented scars from the procedure. Return if lesion fails to fully resolve.  Cryotherapy Aftercare  . Wash gently with soap and water everyday.   Marland Kitchen Apply Vaseline and Band-Aid daily until healed.  Wound Care Instructions  1. Cleanse wound gently with soap and water once a day then pat dry with clean gauze. Apply a thing coat of Petrolatum (petroleum jelly, "Vaseline") over the wound (unless you have an allergy to this). We recommend that you use a new, sterile tube of Vaseline. Do not pick or remove scabs. Do not remove the yellow or white "healing tissue" from the base of the wound.  2. Cover the wound with fresh, clean, nonstick gauze and secure with paper tape. You may use Band-Aids in place of gauze and tape if the would is small enough, but would recommend trimming much of the tape off as there is often too much. Sometimes Band-Aids can irritate the skin.  3. You should call the office for your biopsy report after 1 week if you have not already been contacted.  4. If you experience any problems, such as abnormal amounts of bleeding, swelling, significant bruising, significant pain, or evidence of infection, please call the office immediately.  5. FOR ADULT SURGERY PATIENTS: If you need something for pain relief you may take 1 extra strength Tylenol (acetaminophen) AND 2 Ibuprofen (200mg  each) together every 4 hours as needed for pain. (do not take these if you are allergic to them or if you have a reason you should not take them.) Typically, you may  only need pain medication for 1 to 3 days.     Seborrheic Keratosis  What causes seborrheic keratoses? Seborrheic keratoses are harmless, common skin growths that first appear during adult life.  As time goes by, more growths appear.  Some people may develop a large number of them.  Seborrheic keratoses appear on both covered and uncovered body parts.  They are not caused by sunlight.  The tendency to develop seborrheic keratoses can be inherited.  They vary in color from skin-colored to gray, brown, or even black.  They can be either smooth or have a rough, warty surface.   Seborrheic keratoses are superficial and look as if they were stuck on the skin.  Under the microscope this type of keratosis looks like layers upon layers of skin.  That is why at times the top layer may seem to fall off, but the rest of the growth remains and re-grows.    Treatment Seborrheic keratoses do not need to be treated, but can easily be removed in the office.  Seborrheic keratoses often cause symptoms when they rub on clothing or jewelry.  Lesions can be in the way of shaving.  If they become inflamed, they can cause itching, soreness, or burning.  Removal of a seborrheic keratosis can be accomplished by freezing, burning, or surgery. If any spot bleeds, scabs, or grows rapidly, please return to have it checked, as these can be an indication of a skin cancer.

## 2020-06-19 ENCOUNTER — Telehealth: Payer: Self-pay

## 2020-06-19 DIAGNOSIS — C44311 Basal cell carcinoma of skin of nose: Secondary | ICD-10-CM

## 2020-06-19 NOTE — Telephone Encounter (Signed)
Advised pt of bx results.  Advised pt we would send referral to Dr. Lacinda Axon for [facility] surgery for [facility] of the L upper nasal dorsum.  Advised Dr. Jonathon Jordan office would call him with appointment./sh

## 2020-06-19 NOTE — Telephone Encounter (Signed)
-----   Message from Brendolyn Patty, MD sent at 06/18/2020  2:45 PM EDT ----- L upper nasal dorsum- [facility] (see scanned [facility] report)  He will need Mohs surgery at [facility]

## 2020-07-08 ENCOUNTER — Ambulatory Visit: Payer: 59 | Admitting: Gastroenterology

## 2020-11-11 ENCOUNTER — Ambulatory Visit: Payer: 59 | Admitting: Dermatology

## 2020-12-17 ENCOUNTER — Other Ambulatory Visit: Payer: Self-pay

## 2020-12-17 ENCOUNTER — Encounter: Payer: Self-pay | Admitting: Dermatology

## 2020-12-17 ENCOUNTER — Ambulatory Visit (INDEPENDENT_AMBULATORY_CARE_PROVIDER_SITE_OTHER): Payer: 59 | Admitting: Dermatology

## 2020-12-17 DIAGNOSIS — L578 Other skin changes due to chronic exposure to nonionizing radiation: Secondary | ICD-10-CM

## 2020-12-17 DIAGNOSIS — L820 Inflamed seborrheic keratosis: Secondary | ICD-10-CM

## 2020-12-17 DIAGNOSIS — D225 Melanocytic nevi of trunk: Secondary | ICD-10-CM | POA: Diagnosis not present

## 2020-12-17 DIAGNOSIS — Z1283 Encounter for screening for malignant neoplasm of skin: Secondary | ICD-10-CM | POA: Diagnosis not present

## 2020-12-17 DIAGNOSIS — D1800 Hemangioma unspecified site: Secondary | ICD-10-CM

## 2020-12-17 DIAGNOSIS — L720 Epidermal cyst: Secondary | ICD-10-CM

## 2020-12-17 DIAGNOSIS — L814 Other melanin hyperpigmentation: Secondary | ICD-10-CM

## 2020-12-17 DIAGNOSIS — Z85828 Personal history of other malignant neoplasm of skin: Secondary | ICD-10-CM

## 2020-12-17 DIAGNOSIS — L918 Other hypertrophic disorders of the skin: Secondary | ICD-10-CM

## 2020-12-17 DIAGNOSIS — D229 Melanocytic nevi, unspecified: Secondary | ICD-10-CM

## 2020-12-17 DIAGNOSIS — L821 Other seborrheic keratosis: Secondary | ICD-10-CM

## 2020-12-17 NOTE — Patient Instructions (Addendum)
Wound Care Instructions  1. Cleanse wound gently with soap and water once a day then pat dry with clean gauze. Apply a thing coat of Petrolatum (petroleum jelly, "Vaseline") over the wound (unless you have an allergy to this). We recommend that you use a new, sterile tube of Vaseline. Do not pick or remove scabs. Do not remove the yellow or white "healing tissue" from the base of the wound.  2. Cover the wound with fresh, clean, nonstick gauze and secure with paper tape. You may use Band-Aids in place of gauze and tape if the would is small enough, but would recommend trimming much of the tape off as there is often too much. Sometimes Band-Aids can irritate the skin.  3. You should call the office for your biopsy report after 1 week if you have not already been contacted.  4. If you experience any problems, such as abnormal amounts of bleeding, swelling, significant bruising, significant pain, or evidence of infection, please call the office immediately.  5. FOR ADULT SURGERY PATIENTS: If you need something for pain relief you may take 1 extra strength Tylenol (acetaminophen) AND 2 Ibuprofen (200mg  each) together every 4 hours as needed for pain. (do not take these if you are allergic to them or if you have a reason you should not take them.) Typically, you may only need pain medication for 1 to 3 days.     Recommend daily broad spectrum sunscreen SPF 30+ to sun-exposed areas, reapply every 2 hours as needed. Call for new or changing lesions.  Staying in the shade or wearing long sleeves, sun glasses (UVA+UVB protection) and wide brim hats (4-inch brim around the entire circumference of the hat) are also recommended for sun protection.    If you have any questions or concerns for your doctor, please call our main line at [phone] and press option 4 to reach your doctor's medical assistant. If no one answers, please leave a voicemail as directed and we will return your call as soon as possible.  Messages left after 4 pm will be answered the following business day.   You may also send [place] a message via [place]. We typically respond to MyChart messages within 1-2 business days.  For prescription refills, please ask your pharmacy to contact our office. Our fax number is [phone].  If you have an urgent issue when the clinic is closed that cannot wait until the next business day, you can page your doctor at the number below.    Please note that while we do our best to be available for urgent issues outside of office hours, we are not available 24/7.   If you have an urgent issue and are unable to reach [place], you may choose to seek medical care at your doctor's office, retail clinic, urgent care center, or emergency room.  If you have a medical emergency, please immediately call 911 or go to the emergency department.  Pager Numbers  - Dr. Nehemiah Massed: [phone]  - Dr. Laurence Ferrari: [phone]  - Dr. Nicole Kindred: [phone]  In the event of inclement weather, please call our main line at [phone] for an update on the status of any delays or closures.  Dermatology Medication Tips: Please keep the boxes that topical medications come in in order to help keep track of the instructions about where and how to use these. Pharmacies typically print the medication instructions only on the boxes and not directly on the medication tubes.   If your medication is too expensive,  please contact our office at [phone] option 4 or send [place] a message through [place].   We are unable to tell what your co-pay for medications will be in advance as this is different depending on your insurance coverage. However, we may be able to find a substitute medication at lower cost or fill out paperwork to get insurance to cover a needed medication.   If a prior authorization is required to get your medication covered by your insurance company, please allow [place] 1-2 business days to complete this process.  Drug  prices often vary depending on where the prescription is filled and some pharmacies may offer cheaper prices.  The website www.goodrx.com contains coupons for medications through different pharmacies. The prices here do not account for what the cost may be with help from insurance (it may be cheaper with your insurance), but the website can give you the price if you did not use any insurance.  - You can print the associated coupon and take it with your prescription to the pharmacy.  - You may also stop by our office during regular business hours and pick up a GoodRx coupon card.  - If you need your prescription sent electronically to a different pharmacy, notify our office through [facility] or by phone at [phone] option 4.  Prior to procedure, discussed risks of blister formation, small wound, skin dyspigmentation, or rare scar following cryotherapy.    Cryotherapy  Cryotherapy is the treatment of lesions with the application of a cold substance.  In most cases, liquid nitrogen is used to destroy the lesion(s).  Liquid nitrogen is so cold, -196 Celsius, it feels like it is burning when it is applied.  After treatment with liquid nitrogen, there may be some burning sensation or pain that can last up to 24 hours.  The area may also be swollen and red.  The discomfort can be relieved with ibuprofen (Advil, Motrin), acetaminophen (Extra Strength Tylenol), or similar pain relief medication.  Within 24 to 48 hours, a blister may form.  Occasionally, these blisters will become filled with blood and become very dark.  This is no cause for concern.  The blister will gradually dry up over a period of several days, eventually separating from the healing skin below in about one to two weeks.  The surrounding skin will become red and the area may become itchy.  This is all part of the normal healing process.  Occasionally, the crusts will last as long as four weeks when certain deeper spots on the  skin are treated.  Sometimes a permanent white mark or scar will be left after healing.  You may continue all of your normal activities as long as they do not cause pain in the treated areas.  It is okay to get the area wet.  After therapy or if the blister is still intact - You may treat it like normal skin.  Covering the area with a bandage may offer some comfort and protection from trauma but is not absolutely necessary.  If the blister is uncomfortable - Clean a small needle with rubbing alcohol, then gently make a small hole in the side of the blister to drain the blister fluid.  This often gives immediate relief.  Do not remove the blister roof, as the blister aids in healing.  In time it will fall off on its own.   Once the blister roof falls off or a sore forms -  . Clean the blister sites  with soap and water.  Rinse the area and pat dry.  Do not force off the blister roof or crust. . Apply an antibiotic ointment such as Polysporin or Bacitracin. . A bandage may be applied loosely over the blister until it is healed if desired. . Call our office if you are concerned it may be infected.  Some redness, itching and oozing is part of the normal healing process.  Signs of infection include increasing redness, increasing pain, swelling, heat, or yellow discharge.

## 2020-12-17 NOTE — Progress Notes (Signed)
Follow-Up Visit   Subjective  Ralph Villegas is a 55 y.o. male who presents for the following: Follow-up (Hx BCC. Left upper nasal dorsum. Bx: 05/20/2020. Mohs with Dr. Lacinda Axon: 07/29/2020. Well healed. ) and Skin Tag (Areas at underarms and right posterior thigh would like removed. Rubbed, irritated. ). He also has other itchy growths on his trunk that he would like removed.    The following portions of the chart were reviewed this encounter and updated as appropriate:      Review of Systems: No other skin or systemic complaints except as noted in HPI or Assessment and Plan.  Objective  Well appearing patient in no apparent distress; mood and affect are within normal limits.  A focused examination was performed including face, torso, axillae, legs. Relevant physical exam findings are noted in the Assessment and Plan.  Objective  Left Nasal dorsum: Well healed scar with no evidence of recurrence.   Objective  Right Axilla x6, left axilla x6, right posterior upper thigh x1 (13): Fleshy, skin-colored pedunculated papules.    Objective  Right Flank, right lateral abdomen: 6 x 4 mm speckled med dark brown papule Right Flank   5 x 4 mm two tone brown papule darker edge Right lateral abd  Objective  left lower neck x3, left lower chest x4, right lower chest x6, left posterior shoulder x1, left mid back x4, right mid back x1 (19): Erythematous keratotic or waxy stuck-on papule   Objective  Left back: Subcutaneous nodule.   Assessment & Plan  History of basal cell carcinoma (BCC) Left Nasal dorsum  Observe. Watch for recurrence.   Recommend daily broad spectrum sunscreen SPF 30+ to sun-exposed areas, reapply every 2 hours as needed. Call for new or changing lesions.  Staying in the shade or wearing long sleeves, sun glasses (UVA+UVB protection) and wide brim hats (4-inch brim around the entire circumference of the hat) are also recommended for sun protection.      Skin tag  (13) Right Axilla x6, left axilla x6, right posterior upper thigh x1  Procedure: Skin tag removal Informed consent:  Discussed risks (permanent scarring, infection, pain, bleeding, bruising, redness, and recurrence of the lesion) and benefits of the procedure, as well as the alternatives.  He is aware that skin tags are benign lesions, and their removal is often not considered medically necessary.  Informed consent was obtained. Anesthesia: lidocaine/epi  The area was prepared and draped in a standard fashion. Snip removal was performed.   Antibiotic ointment and a sterile dressing were applied.   The patient tolerated procedure well. The patient was instructed on post-op care.   Number of lesions removed: 13   Nevus Right Flank, right lateral abdomen  Benign-appearing.  Stable. Observation.  Call clinic for new or changing moles.  Recommend daily use of broad spectrum spf 30+ sunscreen to sun-exposed areas.    Inflamed seborrheic keratosis (19) left lower neck x3, left lower chest x4, right lower chest x6, left posterior shoulder x1, left mid back x4, right mid back x1  Destruction of lesion - left lower neck x3, left lower chest x4, right lower chest x6, left posterior shoulder x1, left mid back x4, right mid back x1  Destruction method: cryotherapy   Informed consent: discussed and consent obtained   Lesion destroyed using liquid nitrogen: Yes   Region frozen until ice ball extended beyond lesion: Yes   Outcome: patient tolerated procedure well with no complications   Post-procedure details: wound care instructions  given    Epidermal inclusion cyst Left back  Reassured benign growth.  Recommend observation.  Discussed surgical excision in office if changes noted or symptomatic.   Lentigines - Scattered tan macules - Due to sun exposure - Benign-appering, observe - Recommend daily broad spectrum sunscreen SPF 30+ to sun-exposed areas, reapply every 2 hours as needed. - Call  for any changes  Seborrheic Keratoses - Stuck-on, waxy, tan-brown papules and/or plaques  - Benign-appearing - Discussed benign etiology and prognosis. - Observe - Call for any changes  Melanocytic Nevi - Tan-brown and/or pink-flesh-colored symmetric macules and papules - Benign appearing on exam today - Observation - Call clinic for new or changing moles - Recommend daily use of broad spectrum spf 30+ sunscreen to sun-exposed areas.   Hemangiomas - Red papules - Discussed benign nature - Observe - Call for any changes  Actinic Damage - Chronic condition, secondary to cumulative UV/sun exposure - diffuse scaly erythematous macules with underlying dyspigmentation - Recommend daily broad spectrum sunscreen SPF 30+ to sun-exposed areas, reapply every 2 hours as needed.  - Staying in the shade or wearing long sleeves, sun glasses (UVA+UVB protection) and wide brim hats (4-inch brim around the entire circumference of the hat) are also recommended for sun protection.  - Call for new or changing lesions.  Skin cancer screening performed today, waist up.   Return in about 6 months (around 06/19/2021) for waist up exam.   I, Emelia Salisbury, CMA, am acting as scribe for Brendolyn Patty, MD.  Documentation: I have reviewed the above documentation for accuracy and completeness, and I agree with the above.  Brendolyn Patty MD

## 2021-06-08 ENCOUNTER — Other Ambulatory Visit: Payer: Self-pay

## 2021-06-08 ENCOUNTER — Ambulatory Visit (INDEPENDENT_AMBULATORY_CARE_PROVIDER_SITE_OTHER): Payer: 59 | Admitting: Dermatology

## 2021-06-08 DIAGNOSIS — Z86018 Personal history of other benign neoplasm: Secondary | ICD-10-CM

## 2021-06-08 DIAGNOSIS — L821 Other seborrheic keratosis: Secondary | ICD-10-CM

## 2021-06-08 DIAGNOSIS — D2361 Other benign neoplasm of skin of right upper limb, including shoulder: Secondary | ICD-10-CM

## 2021-06-08 DIAGNOSIS — L814 Other melanin hyperpigmentation: Secondary | ICD-10-CM

## 2021-06-08 DIAGNOSIS — D485 Neoplasm of uncertain behavior of skin: Secondary | ICD-10-CM

## 2021-06-08 DIAGNOSIS — D229 Melanocytic nevi, unspecified: Secondary | ICD-10-CM

## 2021-06-08 DIAGNOSIS — D225 Melanocytic nevi of trunk: Secondary | ICD-10-CM | POA: Diagnosis not present

## 2021-06-08 DIAGNOSIS — C44519 Basal cell carcinoma of skin of other part of trunk: Secondary | ICD-10-CM

## 2021-06-08 DIAGNOSIS — D2362 Other benign neoplasm of skin of left upper limb, including shoulder: Secondary | ICD-10-CM

## 2021-06-08 DIAGNOSIS — L820 Inflamed seborrheic keratosis: Secondary | ICD-10-CM | POA: Diagnosis not present

## 2021-06-08 DIAGNOSIS — Z1283 Encounter for screening for malignant neoplasm of skin: Secondary | ICD-10-CM | POA: Diagnosis not present

## 2021-06-08 DIAGNOSIS — C4491 Basal cell carcinoma of skin, unspecified: Secondary | ICD-10-CM

## 2021-06-08 DIAGNOSIS — L720 Epidermal cyst: Secondary | ICD-10-CM

## 2021-06-08 DIAGNOSIS — L309 Dermatitis, unspecified: Secondary | ICD-10-CM

## 2021-06-08 DIAGNOSIS — D239 Other benign neoplasm of skin, unspecified: Secondary | ICD-10-CM

## 2021-06-08 DIAGNOSIS — D1800 Hemangioma unspecified site: Secondary | ICD-10-CM

## 2021-06-08 DIAGNOSIS — L578 Other skin changes due to chronic exposure to nonionizing radiation: Secondary | ICD-10-CM

## 2021-06-08 DIAGNOSIS — Z85828 Personal history of other malignant neoplasm of skin: Secondary | ICD-10-CM

## 2021-06-08 HISTORY — DX: Basal cell carcinoma of skin, unspecified: C44.91

## 2021-06-08 MED ORDER — CLOBETASOL PROPIONATE 0.05 % EX CREA: TOPICAL_CREAM | CUTANEOUS | 1 refills | Status: AC

## 2021-06-08 NOTE — Patient Instructions (Addendum)
Cryotherapy Aftercare  Wash gently with soap and water everyday.   Apply Vaseline and Band-Aid daily until healed.   Wound Care Instructions  Cleanse wound gently with soap and water once a day then pat dry with clean gauze. Apply a thing coat of Petrolatum (petroleum jelly, "Vaseline") over the wound (unless you have an allergy to this). We recommend that you use a new, sterile tube of Vaseline. Do not pick or remove scabs. Do not remove the yellow or white "healing tissue" from the base of the wound.  Cover the wound with fresh, clean, nonstick gauze and secure with paper tape. You may use Band-Aids in place of gauze and tape if the would is small enough, but would recommend trimming much of the tape off as there is often too much. Sometimes Band-Aids can irritate the skin.  You should call the office for your biopsy report after 1 week if you have not already been contacted.  If you experience any problems, such as abnormal amounts of bleeding, swelling, significant bruising, significant pain, or evidence of infection, please call the office immediately.  FOR ADULT SURGERY PATIENTS: If you need something for pain relief you may take 1 extra strength Tylenol (acetaminophen) AND 2 Ibuprofen ('200mg'$  each) together every 4 hours as needed for pain. (do not take these if you are allergic to them or if you have a reason you should not take them.) Typically, you may only need pain medication for 1 to 3 days.    Start clobetasol 0.05% cream 1-2 times daily to affected areas at hands as needed. Avoid applying to face, groin, and axilla. Use as directed. Risk of skin atrophy with long-term use reviewed.   Topical steroids (such as triamcinolone, fluocinolone, fluocinonide, mometasone, clobetasol, halobetasol, betamethasone, hydrocortisone) can cause thinning and lightening of the skin if they are used for too long in the same area. Your physician has selected the right strength medicine for your problem  and area affected on the body. Please use your medication only as directed by your physician to prevent side effects.     Pre-Operative Instructions  You are scheduled for a surgical procedure at [facility]. We recommend you read the following instructions. If you have any questions or concerns, please call the office at [phone].  Shower and wash the entire body with soap and water the day of your surgery paying special attention to cleansing at and around the planned surgery site.  Avoid aspirin or aspirin containing products at least fourteen (14) days prior to your surgical procedure and for at least one week (7 Days) after your surgical procedure. If you take aspirin on a regular basis for heart disease or history of stroke or for any other reason, we may recommend you continue taking aspirin but please notify us if you take this on a regular basis. Aspirin can cause more bleeding to occur during surgery as well as prolonged bleeding and bruising after surgery.   Avoid other nonsteroidal pain medications at least one week prior to surgery and at least one week prior to your surgery. These include medications such as Ibuprofen (Motrin, Advil and Nuprin), Naprosyn, Voltaren, Relafen, etc. If medications are used for therapeutic reasons, please inform us as they can cause increased bleeding or prolonged bleeding during and bruising after surgical procedures.   Please advise [place] if you are taking any "blood thinner" medications such as Coumadin or Dipyridamole or Plavix or similar medications. These cause increased bleeding and prolonged bleeding during  procedures and bruising after surgical procedures. We may have to consider discontinuing these medications briefly prior to and shortly after your surgery if safe to do so.   Please inform us of all medications you are currently taking. All medications that are taken regularly should be taken the day of surgery as you always do.  Nevertheless, we need to be informed of what medications you are taking prior to surgery to know whether they will affect the procedure or cause any complications.   Please inform us of any medication allergies. Also inform us of whether you have allergies to Latex or rubber products or whether you have had any adverse reaction to Lidocaine or Epinephrine.  Please inform us of any prosthetic or artificial body parts such as artificial heart valve, joint replacements, etc., or similar condition that might require preoperative antibiotics.   We recommend avoidance of alcohol at least two weeks prior to surgery and continued avoidance for at least two weeks after surgery.   We recommend discontinuation of tobacco smoking at least two weeks prior to surgery and continued abstinence for at least two weeks after surgery.  Do not plan strenuous exercise, strenuous work or strenuous lifting for approximately four weeks after your surgery.   We request if you are unable to make your scheduled surgical appointment, please call us at least a week in advance or as soon as you are aware of a problem so that we can cancel or reschedule the appointment.   You MAY TAKE TYLENOL (acetaminophen) for pain as it is not a blood thinner.   PLEASE PLAN TO BE IN TOWN FOR TWO WEEKS FOLLOWING SURGERY, THIS IS IMPORTANT SO YOU CAN BE CHECKED FOR DRESSING CHANGES, SUTURE REMOVAL AND TO MONITOR FOR POSSIBLE COMPLICATIONS.    Recommend daily broad spectrum sunscreen SPF 30+ to sun-exposed areas, reapply every 2 hours as needed. Call for new or changing lesions.  Staying in the shade or wearing long sleeves, sun glasses (UVA+UVB protection) and wide brim hats (4-inch brim around the entire circumference of the hat) are also recommended for sun protection.    If you have any questions or concerns for your doctor, please call our main line at [phone] and press option 4 to reach your doctor's medical assistant. If no one  answers, please leave a voicemail as directed and we will return your call as soon as possible. Messages left after 4 pm will be answered the following business day.   You may also send [place] a message via [place]. We typically respond to MyChart messages within 1-2 business days.  For prescription refills, please ask your pharmacy to contact our office. Our fax number is [phone].  If you have an urgent issue when the clinic is closed that cannot wait until the next business day, you can page your doctor at the number below.    Please note that while we do our best to be available for urgent issues outside of office hours, we are not available 24/7.   If you have an urgent issue and are unable to reach [place], you may choose to seek medical care at your doctor's office, retail clinic, urgent care center, or emergency room.  If you have a medical emergency, please immediately call 911 or go to the emergency department.  Pager Numbers  - Dr. Nehemiah Massed: [phone]  - Dr. Laurence Ferrari: [phone]  - Dr. Nicole Kindred: [phone]  In the event of inclement weather, please call our main line at [phone] for an update  on the status of any delays or closures.  Dermatology Medication Tips: Please keep the boxes that topical medications come in in order to help keep track of the instructions about where and how to use these. Pharmacies typically print the medication instructions only on the boxes and not directly on the medication tubes.   If your medication is too expensive, please contact our office at [phone] option 4 or send [place] a message through [place].   We are unable to tell what your co-pay for medications will be in advance as this is different depending on your insurance coverage. However, we may be able to find a substitute medication at lower cost or fill out paperwork to get insurance to cover a needed medication.   If a prior authorization is required to get your medication covered  by your insurance company, please allow [place] 1-2 business days to complete this process.  Drug prices often vary depending on where the prescription is filled and some pharmacies may offer cheaper prices.  The website www.goodrx.com contains coupons for medications through different pharmacies. The prices here do not account for what the cost may be with help from insurance (it may be cheaper with your insurance), but the website can give you the price if you did not use any insurance.  - You can print the associated coupon and take it with your prescription to the pharmacy.  - You may also stop by our office during regular business hours and pick up a GoodRx coupon card.  - If you need your prescription sent electronically to a different pharmacy, notify our office through [facility] or by phone at [phone] option 4.

## 2021-06-08 NOTE — Progress Notes (Signed)
Follow-Up Visit   Subjective  Ralph Villegas is a 55 y.o. male who presents for the following: UBSE (Patient here for upper body skin exam and skin cancer screening. Patient does have a hx of BCC and dysplastic nevi. Patient also with a cyst at back that he would like to schedule an excision for. There is a spot at scalp that patient has had forever but may be getting larger. /).  He tends to pick at it.  Also other raised spots on face and truck that get irritated.  He also has a persistent scaly rash on R hand. No hx of eczema    The following portions of the chart were reviewed this encounter and updated as appropriate:       Review of Systems:  No other skin or systemic complaints except as noted in HPI or Assessment and Plan.  Objective  Well appearing patient in no apparent distress; mood and affect are within normal limits.  All skin waist up examined.  right flank, right lateral abdomen 6 x 4 mm speckled med dark brown papule Right Flank   5 x 4 mm two tone brown papule darker edge Right lateral abd  4 mm flesh papule with central crust Left perioral upper lip, cut by shaving this am  3 mm medium dark brown macule, appears as two adjacent Right lower back   inferior vertex scalp x 1, sternum x 1, left lower abdomen residual x 1, right forehead x 5, right flank x 2 (10) Erythematous keratotic or waxy stuck-on papule or plaque.   Spinal Mid Back 1.3cm firm subcutaneous nodule  Right Abdomen at Costal Margin 7 x 4 mm pink brown papule, slightly pearly ISK r/o BCC       Right Forearm, left shoulder Firm pink/brown papulenodule with dimple sign.   Right Hand Right palmar hand and fingers with hyperkeratosis and fissuring   Assessment & Plan  Nevus right flank, right lateral abdomen  Benign-appearing.  Stable. Observation.  Call clinic for new or changing lesions.  Recommend daily use of broad spectrum spf 30+ sunscreen to sun-exposed areas.  Discussed  shave removal of irritated nevus L upper lip    Inflamed seborrheic keratosis inferior vertex scalp x 1, sternum x 1, left lower abdomen residual x 1, right forehead x 5, right flank x 2  Destruction of lesion - inferior vertex scalp x 1, sternum x 1, left lower abdomen residual x 1, right forehead x 5, right flank x 2 Complexity: simple   Destruction method: cryotherapy   Informed consent: discussed and consent obtained   Lesion destroyed using liquid nitrogen: Yes   Region frozen until ice ball extended beyond lesion: Yes   Outcome: patient tolerated procedure well with no complications   Post-procedure details: wound care instructions given    Epidermal inclusion cyst Spinal Mid Back  Benign-appearing. Exam most consistent with an epidermal inclusion cyst. Discussed that a cyst is a benign growth that can grow over time and sometimes get irritated or inflamed. Recommend observation if it is not bothersome. Discussed option of surgical excision to remove it if it is growing, symptomatic, or other changes noted. Please call for new or changing lesions so they can be evaluated.  Cyst with symptoms and/or recent change.  Discussed surgical excision to remove, including resulting scar and possible recurrence.  Patient will schedule for surgery. Pre-op information given.     Neoplasm of uncertain behavior of skin Right Abdomen at Costal Margin  Skin / nail biopsy Type of biopsy: tangential   Informed consent: discussed and consent obtained   Patient was prepped and draped in usual sterile fashion: Area prepped with alcohol. Anesthesia: the lesion was anesthetized in a standard fashion   Anesthetic:  1% lidocaine w/ epinephrine 1-100,000 buffered w/ 8.4% NaHCO3 Instrument used: flexible razor blade   Hemostasis achieved with: pressure, aluminum chloride and electrodesiccation   Outcome: patient tolerated procedure well    Destruction of lesion  Destruction method:  electrodesiccation and curettage   Informed consent: discussed and consent obtained   Timeout:  patient name, date of birth, surgical site, and procedure verified Curettage performed in three different directions: Yes   Electrodesiccation performed over the curetted area: Yes   Final wound size (cm):  1.1 Hemostasis achieved with:  pressure, aluminum chloride and electrodesiccation Outcome: patient tolerated procedure well with no complications   Post-procedure details: wound care instructions given   Additional details:  Mupirocin ointment and Bandaid applied    Specimen 1 - Surgical pathology Differential Diagnosis: ISK r/o BCC  Check Margins: No 7 x 4 mm pink brown papule, slightly pearly   Dermatofibroma Right Forearm, left shoulder  Benign-appearing.  Observation.  Call clinic for new or changing lesions.  Recommend daily use of broad spectrum spf 30+ sunscreen to sun-exposed areas.    Hand dermatitis Right Hand  Flared Hand Dermatitis is a chronic type of eczema that can come and go on the hands and fingers.  While there is no cure, the rash and symptoms can be managed with topical prescription medications, and for more severe cases, with systemic medications.  Recommend mild soap and routine use of moisturizing cream after handwashing.  Minimize soap/water exposure when possible.     Start clobetasol 0.05% cream 1-2 times daily to affected areas at hands as needed. Avoid applying to face, groin, and axilla. Use as directed. Risk of skin atrophy with long-term use reviewed.  Recommend AmLactin cream  Topical steroids (such as triamcinolone, fluocinolone, fluocinonide, mometasone, clobetasol, halobetasol, betamethasone, hydrocortisone) can cause thinning and lightening of the skin if they are used for too long in the same area. Your physician has selected the right strength medicine for your problem and area affected on the body. Please use your medication only as directed by  your physician to prevent side effects.    clobetasol cream (TEMOVATE) 0.05 % - Right Hand Apply 1-2 times daily to affected areas at hands as needed. Avoid applying to face, groin, and axilla. Use as directed. Risk of skin atrophy with long-term use reviewed.  Lentigines - Scattered tan macules - Due to sun exposure - Benign-appearing, observe - Recommend daily broad spectrum sunscreen SPF 30+ to sun-exposed areas, reapply every 2 hours as needed. - Call for any changes  Seborrheic Keratoses - Stuck-on, waxy, tan-brown papules and/or plaques  - Benign-appearing - Discussed benign etiology and prognosis. - Observe - Call for any changes  Melanocytic Nevi - Tan-brown and/or pink-flesh-colored symmetric macules and papules - Benign appearing on exam today - Observation - Call clinic for new or changing moles - Recommend daily use of broad spectrum spf 30+ sunscreen to sun-exposed areas.   Hemangiomas - Red papules - Discussed benign nature - Observe - Call for any changes  Actinic Damage - Chronic condition, secondary to cumulative UV/sun exposure - diffuse scaly erythematous macules with underlying dyspigmentation - Recommend daily broad spectrum sunscreen SPF 30+ to sun-exposed areas, reapply every 2 hours as needed.  - Staying in  the shade or wearing long sleeves, sun glasses (UVA+UVB protection) and wide brim hats (4-inch brim around the entire circumference of the hat) are also recommended for sun protection.  - Call for new or changing lesions.  Skin cancer screening performed today.  History of Basal Cell Carcinoma of the Skin - No evidence of recurrence today - Recommend regular full body skin exams - Recommend daily broad spectrum sunscreen SPF 30+ to sun-exposed areas, reapply every 2 hours as needed.  - Call if any new or changing lesions are noted between office visits  History of Dysplastic Nevi - No evidence of recurrence today - Recommend regular full  body skin exams - Recommend daily broad spectrum sunscreen SPF 30+ to sun-exposed areas, reapply every 2 hours as needed.  - Call if any new or changing lesions are noted between office visits  Return for Surgery for cyst, as scheduled.  Graciella Belton, RMA, am acting as scribe for Brendolyn Patty, MD .  Documentation: I have reviewed the above documentation for accuracy and completeness, and I agree with the above.  Brendolyn Patty MD

## 2021-06-15 ENCOUNTER — Telehealth: Payer: Self-pay

## 2021-06-15 NOTE — Telephone Encounter (Signed)
-----   Message from Brendolyn Patty, MD sent at 06/15/2021  8:49 AM EDT ----- Skin , right abdomen at costal margin BASAL CELL CARCINOMA, NODULAR PATTERN, BASE INVOLVED  BCC skin cancer- already treated with EDC at time of biopsy   - please call patient

## 2021-06-15 NOTE — Telephone Encounter (Signed)
Advised pt of bx results/sh ?

## 2021-07-13 ENCOUNTER — Ambulatory Visit (INDEPENDENT_AMBULATORY_CARE_PROVIDER_SITE_OTHER): Payer: 59 | Admitting: Dermatology

## 2021-07-13 ENCOUNTER — Other Ambulatory Visit: Payer: Self-pay

## 2021-07-13 DIAGNOSIS — D485 Neoplasm of uncertain behavior of skin: Secondary | ICD-10-CM

## 2021-07-13 DIAGNOSIS — L720 Epidermal cyst: Secondary | ICD-10-CM | POA: Diagnosis not present

## 2021-07-13 NOTE — Progress Notes (Signed)
   Follow-Up Visit   Subjective  Ralph Villegas is a 55 y.o. male who presents for the following: Cyst (Spinal mid back. Patient presents for excision.).   The following portions of the chart were reviewed this encounter and updated as appropriate:       Review of Systems:  No other skin or systemic complaints except as noted in HPI or Assessment and Plan.  Objective  Well appearing patient in no apparent distress; mood and affect are within normal limits.  A focused examination was performed including back. Relevant physical exam findings are noted in the Assessment and Plan.  Spinal Mid Back 1.5 x 1.3cm firm subcutaneous nodule.    Assessment & Plan  Neoplasm of uncertain behavior of skin Spinal Mid Back  Skin excision  Lesion length (cm):  1.5 Lesion width (cm):  1.3 Margin per side (cm):  0.1 Total excision diameter (cm):  1.7 Informed consent: discussed and consent obtained   Timeout: patient name, date of birth, surgical site, and procedure verified   Procedure prep:  Patient was prepped and draped in usual sterile fashion Prep type:  Povidone-iodine Anesthesia: the lesion was anesthetized in a standard fashion   Anesthesia comment:  Total 12 cc - 9cc bupivicaine, 3cc lido w/epi Anesthetic:  1% lidocaine w/ epinephrine 1-100,000 buffered w/ 8.4% NaHCO3 Instrument used: #15 blade   Hemostasis achieved with: pressure and electrodesiccation   Outcome: patient tolerated procedure well with no complications    Skin repair Complexity:  Intermediate Final length (cm):  2 Informed consent: discussed and consent obtained   Timeout: patient name, date of birth, surgical site, and procedure verified   Reason for type of repair: reduce tension to allow closure, reduce the risk of dehiscence, infection, and necrosis, reduce subcutaneous dead space and avoid a hematoma, preserve normal anatomical and functional relationships and enhance both functionality and cosmetic results    Undermining: edges undermined   Subcutaneous layers (deep stitches):  Suture size:  3-0 Suture type: Vicryl (polyglactin 910)   Stitches:  Buried vertical mattress Fine/surface layer approximation (top stitches):  Suture size:  3-0 Suture type: nylon   Stitches: vertical mattress and simple interrupted   Suture removal (days):  7 Hemostasis achieved with: suture Outcome: patient tolerated procedure well with no complications   Post-procedure details: sterile dressing applied and wound care instructions given   Dressing type: pressure dressing (mupirocin)    Anatomic Pathology Report  Sent to [facility].  Return in about 1 week (around 07/20/2021) for suture removal.  I, Jamesetta Orleans, CMA, am acting as scribe for Brendolyn Patty, MD . Documentation: I have reviewed the above documentation for accuracy and completeness, and I agree with the above.  Brendolyn Patty MD

## 2021-07-13 NOTE — Patient Instructions (Signed)
Wound Care Instructions  Cleanse wound gently with soap and water once a day then pat dry with clean gauze. Apply a thing coat of Petrolatum (petroleum jelly, "Vaseline") over the wound (unless you have an allergy to this). We recommend that you use a new, sterile tube of Vaseline. Do not pick or remove scabs. Do not remove the yellow or white "healing tissue" from the base of the wound.  Cover the wound with fresh, clean, nonstick gauze and secure with paper tape. You may use Band-Aids in place of gauze and tape if the would is small enough, but would recommend trimming much of the tape off as there is often too much. Sometimes Band-Aids can irritate the skin.  You should call the office for your biopsy report after 1 week if you have not already been contacted.  If you experience any problems, such as abnormal amounts of bleeding, swelling, significant bruising, significant pain, or evidence of infection, please call the office immediately.  FOR ADULT SURGERY PATIENTS: If you need something for pain relief you may take 1 extra strength Tylenol (acetaminophen) AND 2 Ibuprofen (200mg each) together every 4 hours as needed for pain. (do not take these if you are allergic to them or if you have a reason you should not take them.) Typically, you may only need pain medication for 1 to 3 days.   If you have any questions or concerns for your doctor, please call our main line at [phone] and press option 4 to reach your doctor's medical assistant. If no one answers, please leave a voicemail as directed and we will return your call as soon as possible. Messages left after 4 pm will be answered the following business day.   You may also send us a message via MyChart. We typically respond to MyChart messages within 1-2 business days.  For prescription refills, please ask your pharmacy to contact our office. Our fax number is [phone].  If you have an urgent issue when the clinic is closed that  cannot wait until the next business day, you can page your doctor at the number below.    Please note that while we do our best to be available for urgent issues outside of office hours, we are not available 24/7.   If you have an urgent issue and are unable to reach us, you may choose to seek medical care at your doctor's office, retail clinic, urgent care center, or emergency room.  If you have a medical emergency, please immediately call 911 or go to the emergency department.  Pager Numbers  - Dr. Kowalski: [phone]  - Dr. Moye: [phone]  - Dr. Stewart: [phone]  In the event of inclement weather, please call our main line at [phone] for an update on the status of any delays or closures.  Dermatology Medication Tips: Please keep the boxes that topical medications come in in order to help keep track of the instructions about where and how to use these. Pharmacies typically print the medication instructions only on the boxes and not directly on the medication tubes.   If your medication is too expensive, please contact our office at [phone] option 4 or send us a message through MyChart.   We are unable to tell what your co-pay for medications will be in advance as this is different depending on your insurance coverage. However, we may be able to find a substitute medication at lower cost or fill out paperwork to get insurance to cover a needed   medication.   If a prior authorization is required to get your medication covered by your insurance company, please allow us 1-2 business days to complete this process.  Drug prices often vary depending on where the prescription is filled and some pharmacies may offer cheaper prices.  The website www.goodrx.com contains coupons for medications through different pharmacies. The prices here do not account for what the cost may be with help from insurance (it may be cheaper with your insurance), but the website can give you the  price if you did not use any insurance.  - You can print the associated coupon and take it with your prescription to the pharmacy.  - You may also stop by our office during regular business hours and pick up a GoodRx coupon card.  - If you need your prescription sent electronically to a different pharmacy, notify our office through [facility] MyChart or by phone at [phone] option 4.   

## 2021-07-14 ENCOUNTER — Telehealth: Payer: Self-pay

## 2021-07-14 NOTE — Telephone Encounter (Signed)
Talked to patient and he is not having any problems from surgery yesterday. 

## 2021-07-20 ENCOUNTER — Other Ambulatory Visit: Payer: Self-pay

## 2021-07-20 ENCOUNTER — Ambulatory Visit (INDEPENDENT_AMBULATORY_CARE_PROVIDER_SITE_OTHER): Payer: 59 | Admitting: Dermatology

## 2021-07-20 DIAGNOSIS — Z4802 Encounter for removal of sutures: Secondary | ICD-10-CM

## 2021-07-20 DIAGNOSIS — D492 Neoplasm of unspecified behavior of bone, soft tissue, and skin: Secondary | ICD-10-CM

## 2021-07-20 DIAGNOSIS — L821 Other seborrheic keratosis: Secondary | ICD-10-CM

## 2021-07-20 NOTE — Progress Notes (Signed)
   Follow-Up Visit   Subjective  Ralph Villegas is a 55 y.o. male who presents for the following: cyst vs other (Spinal mid back, 1 wk post op, pt presents for suture removal).   The following portions of the chart were reviewed this encounter and updated as appropriate:       Review of Systems:  No other skin or systemic complaints except as noted in HPI or Assessment and Plan.  Objective  Well appearing patient in no apparent distress; mood and affect are within normal limits.  A focused examination was performed including back. Relevant physical exam findings are noted in the Assessment and Plan.  Mid Back Healing excision site  R temple Waxy tan patch   Assessment & Plan  Neoplasm of skin Mid Back  Healing excision site  Encounter for Removal of Sutures - Incision site at the spinal mid back is clean, dry and intact - Wound cleansed, sutures removed, wound cleansed and steri strips applied.  - Will call patient with pathology results. - Patient advised to keep steri-strips dry until they fall off. - Scars remodel for a full year. - Once steri-strips fall off, patient can apply over-the-counter silicone scar cream each night to help with scar remodeling if desired. - Patient advised to call with any concerns or if they notice any new or changing lesions.   Seborrheic keratosis R temple  Reassured benign age-related growth.  Recommend observation.  Discussed cryotherapy if spot(s) become irritated or inflamed.   Discussed cosmetic procedure (LN2), noncovered.  $60 for 1st lesion and $15 for each additional lesion if done on the same day.  Maximum charge $350.  One touch-up treatment included no charge. Discussed risks of treatment including dyspigmentation, small scar, and/or recurrence. Recommend daily broad spectrum sunscreen SPF 30+/photoprotection to treated areas once healed.   Pt will schedule an appt to have treated.  Return for to be scheduled for cosmetic  SK LN2.  I, Othelia Pulling, RMA, am acting as scribe for Brendolyn Patty, MD .  Documentation: I have reviewed the above documentation for accuracy and completeness, and I agree with the above.  Brendolyn Patty MD

## 2021-07-20 NOTE — Patient Instructions (Signed)

## 2021-07-22 LAB — ANATOMIC PATHOLOGY REPORT

## 2021-07-23 ENCOUNTER — Telehealth: Payer: Self-pay

## 2021-07-23 NOTE — Telephone Encounter (Signed)
Advised pt of bx results/sh ?

## 2021-07-23 NOTE — Telephone Encounter (Signed)
-----   Message from Brendolyn Patty, MD sent at 07/23/2021  3:26 PM EDT ----- Benign cyst - please call patient

## 2021-08-11 ENCOUNTER — Other Ambulatory Visit: Payer: Self-pay

## 2021-08-11 ENCOUNTER — Ambulatory Visit (INDEPENDENT_AMBULATORY_CARE_PROVIDER_SITE_OTHER): Payer: 59 | Admitting: Dermatology

## 2021-08-11 DIAGNOSIS — Z85828 Personal history of other malignant neoplasm of skin: Secondary | ICD-10-CM | POA: Diagnosis not present

## 2021-08-11 DIAGNOSIS — L814 Other melanin hyperpigmentation: Secondary | ICD-10-CM

## 2021-08-11 DIAGNOSIS — L578 Other skin changes due to chronic exposure to nonionizing radiation: Secondary | ICD-10-CM | POA: Diagnosis not present

## 2021-08-11 DIAGNOSIS — L820 Inflamed seborrheic keratosis: Secondary | ICD-10-CM | POA: Diagnosis not present

## 2021-08-11 DIAGNOSIS — L821 Other seborrheic keratosis: Secondary | ICD-10-CM | POA: Diagnosis not present

## 2021-08-11 NOTE — Progress Notes (Signed)
   Follow-Up Visit   Subjective  Ralph Villegas is a 55 y.o. male who presents for the following: Skin lesion (Of the B/L temple - itchy and irritated at times.).   The following portions of the chart were reviewed this encounter and updated as appropriate:       Review of Systems:  No other skin or systemic complaints except as noted in HPI or Assessment and Plan.  Objective  Well appearing patient in no apparent distress; mood and affect are within normal limits.  A focused examination was performed including the face. Relevant physical exam findings are noted in the Assessment and Plan.  R temple x 3, L temple x 1 (4) Waxy tan macules with crusting.    Assessment & Plan  Inflamed seborrheic keratosis R temple x 3, L temple x 1  Destruction of lesion - R temple x 3, L temple x 1  Destruction method: cryotherapy   Informed consent: discussed and consent obtained   Lesion destroyed using liquid nitrogen: Yes   Region frozen until ice ball extended beyond lesion: Yes   Outcome: patient tolerated procedure well with no complications   Post-procedure details: wound care instructions given    Seborrheic Keratoses - Stuck-on, waxy, tan-brown papules and/or plaques  - Benign-appearing - Discussed benign etiology and prognosis. - Observe - Call for any changes  Lentigines - Scattered tan macules - Due to sun exposure - Benign-appering, observe - Recommend daily broad spectrum sunscreen SPF 30+ to sun-exposed areas, reapply every 2 hours as needed. - Call for any changes  History of Basal Cell Carcinoma of the Skin - No evidence of recurrence today - Recommend regular full body skin exams - Recommend daily broad spectrum sunscreen SPF 30+ to sun-exposed areas, reapply every 2 hours as needed.  - Call if any new or changing lesions are noted between office visits  Actinic Damage - chronic, secondary to cumulative UV radiation exposure/sun exposure over time - diffuse  scaly erythematous macules with underlying dyspigmentation - Recommend daily broad spectrum sunscreen SPF 30+ to sun-exposed areas, reapply every 2 hours as needed.  - Recommend staying in the shade or wearing long sleeves, sun glasses (UVA+UVB protection) and wide brim hats (4-inch brim around the entire circumference of the hat). - Call for new or changing lesions.  Return in about 6 months (around 02/08/2022) for UBSE.  Luther Redo, CMA, am acting as scribe for Brendolyn Patty, MD .  Documentation: I have reviewed the above documentation for accuracy and completeness, and I agree with the above.  Brendolyn Patty MD

## 2021-08-11 NOTE — Patient Instructions (Signed)

## 2022-02-09 ENCOUNTER — Ambulatory Visit: Payer: 59 | Admitting: Dermatology
